# Patient Record
Sex: Female | Born: 1955 | Race: White | Hispanic: No | Smoking: Current every day smoker
Health system: Southern US, Community
[De-identification: ages and names within clinical notes are randomized; demographics above are authoritative.]

## PROBLEM LIST (undated history)

## (undated) DIAGNOSIS — E785 Hyperlipidemia, unspecified: Secondary | ICD-10-CM

## (undated) DIAGNOSIS — N951 Menopausal and female climacteric states: Secondary | ICD-10-CM

## (undated) HISTORY — PX: CHOLECYSTECTOMY: SHX55

## (undated) HISTORY — PX: KNEE SURGERY: SHX244

## (undated) HISTORY — PX: APPENDECTOMY: SHX54

## (undated) HISTORY — DX: Hyperlipidemia, unspecified: E78.5

---

## 1999-06-14 ENCOUNTER — Encounter: Admission: RE | Admit: 1999-06-14 | Discharge: 1999-06-14 | Payer: Self-pay | Admitting: Obstetrics and Gynecology

## 1999-06-14 ENCOUNTER — Encounter: Payer: Self-pay | Admitting: Obstetrics and Gynecology

## 2008-04-16 ENCOUNTER — Encounter: Admission: RE | Admit: 2008-04-16 | Discharge: 2008-04-16 | Payer: Self-pay | Admitting: Obstetrics and Gynecology

## 2008-09-14 IMAGING — CR DG CHEST 2V
2 series · 2 of 2 positions shown · non-contrast
Comparison: None

CLINICAL DATA: Chronic calculus cholecystitis.  Smoker.

CHEST - 2 VIEW

[w chest pa]
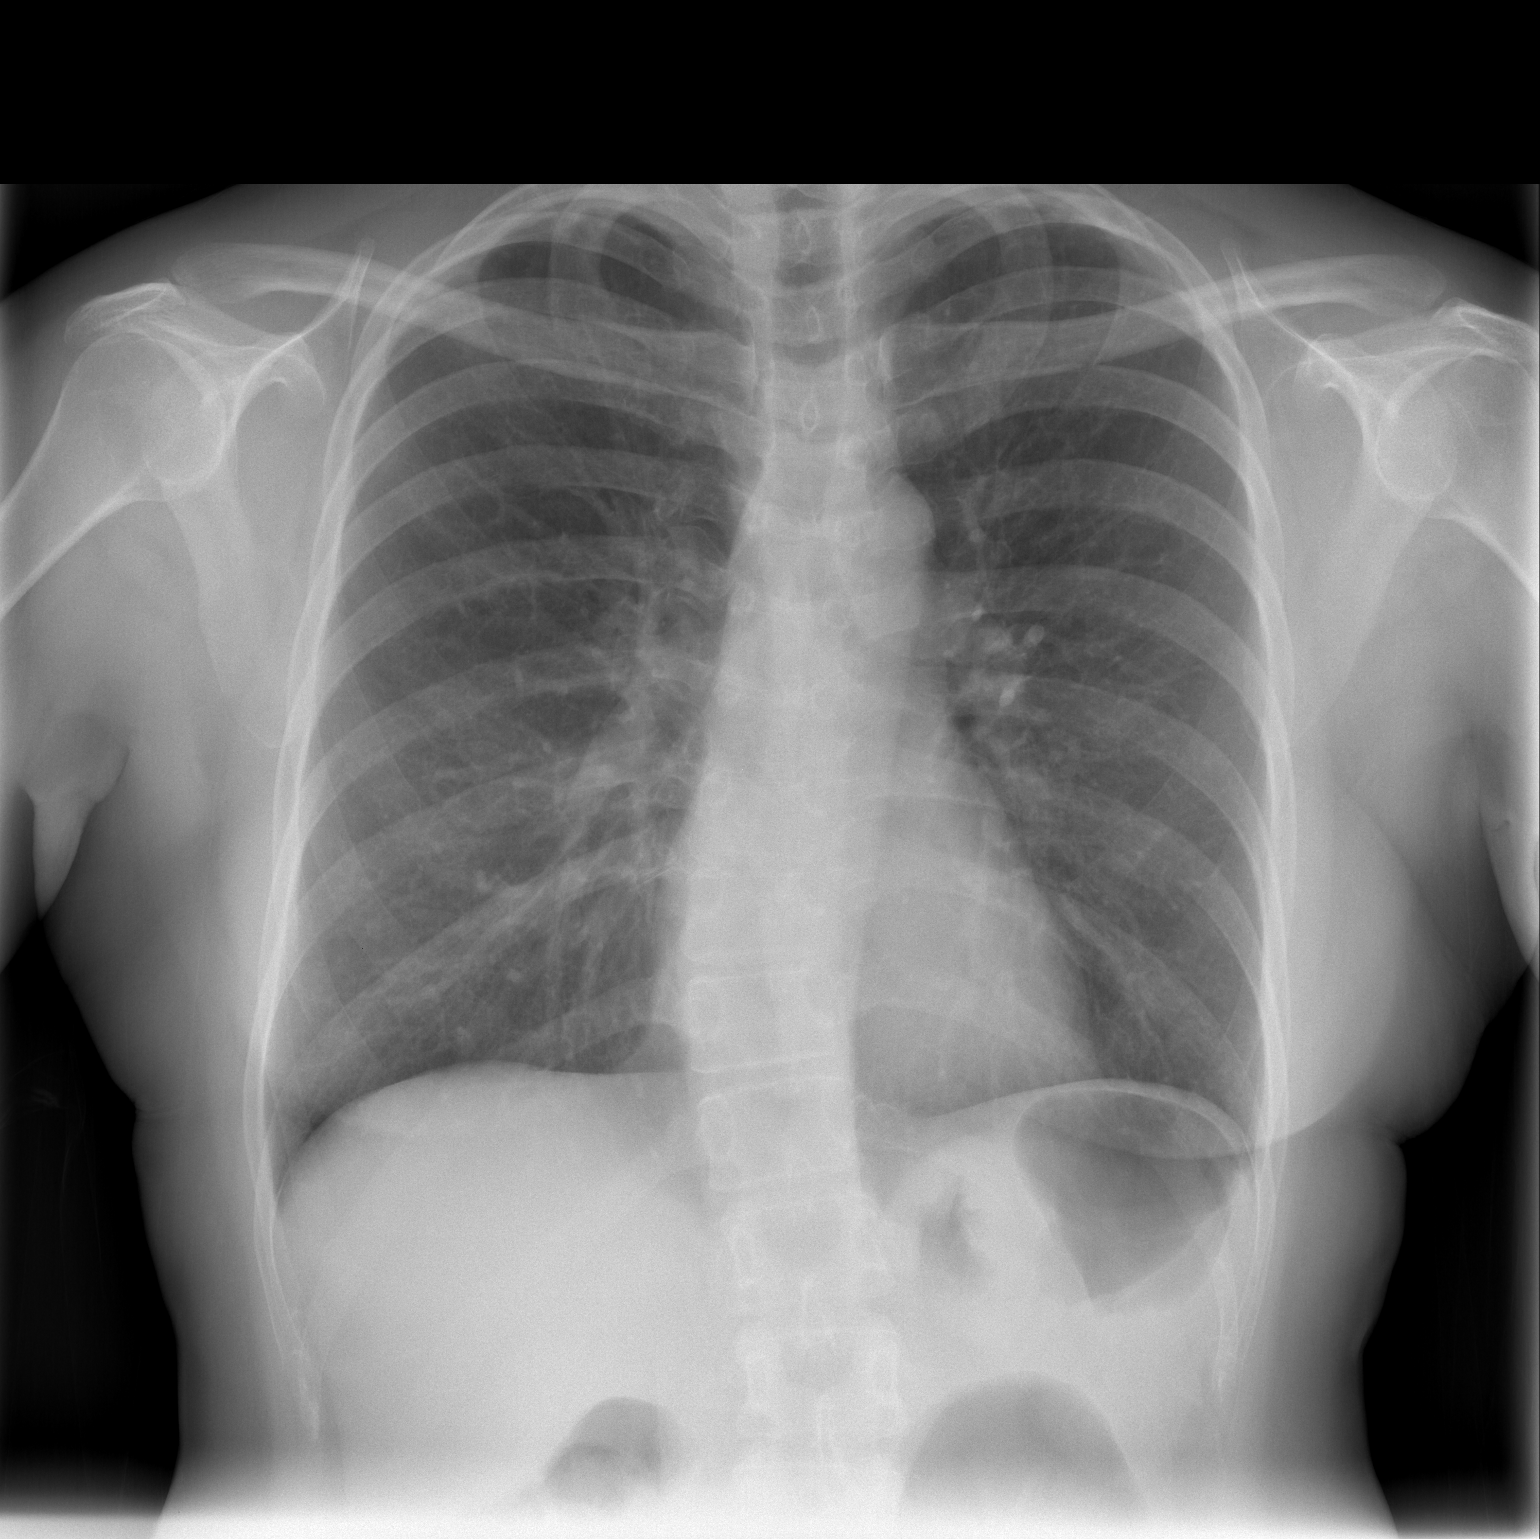

[w chest lat]
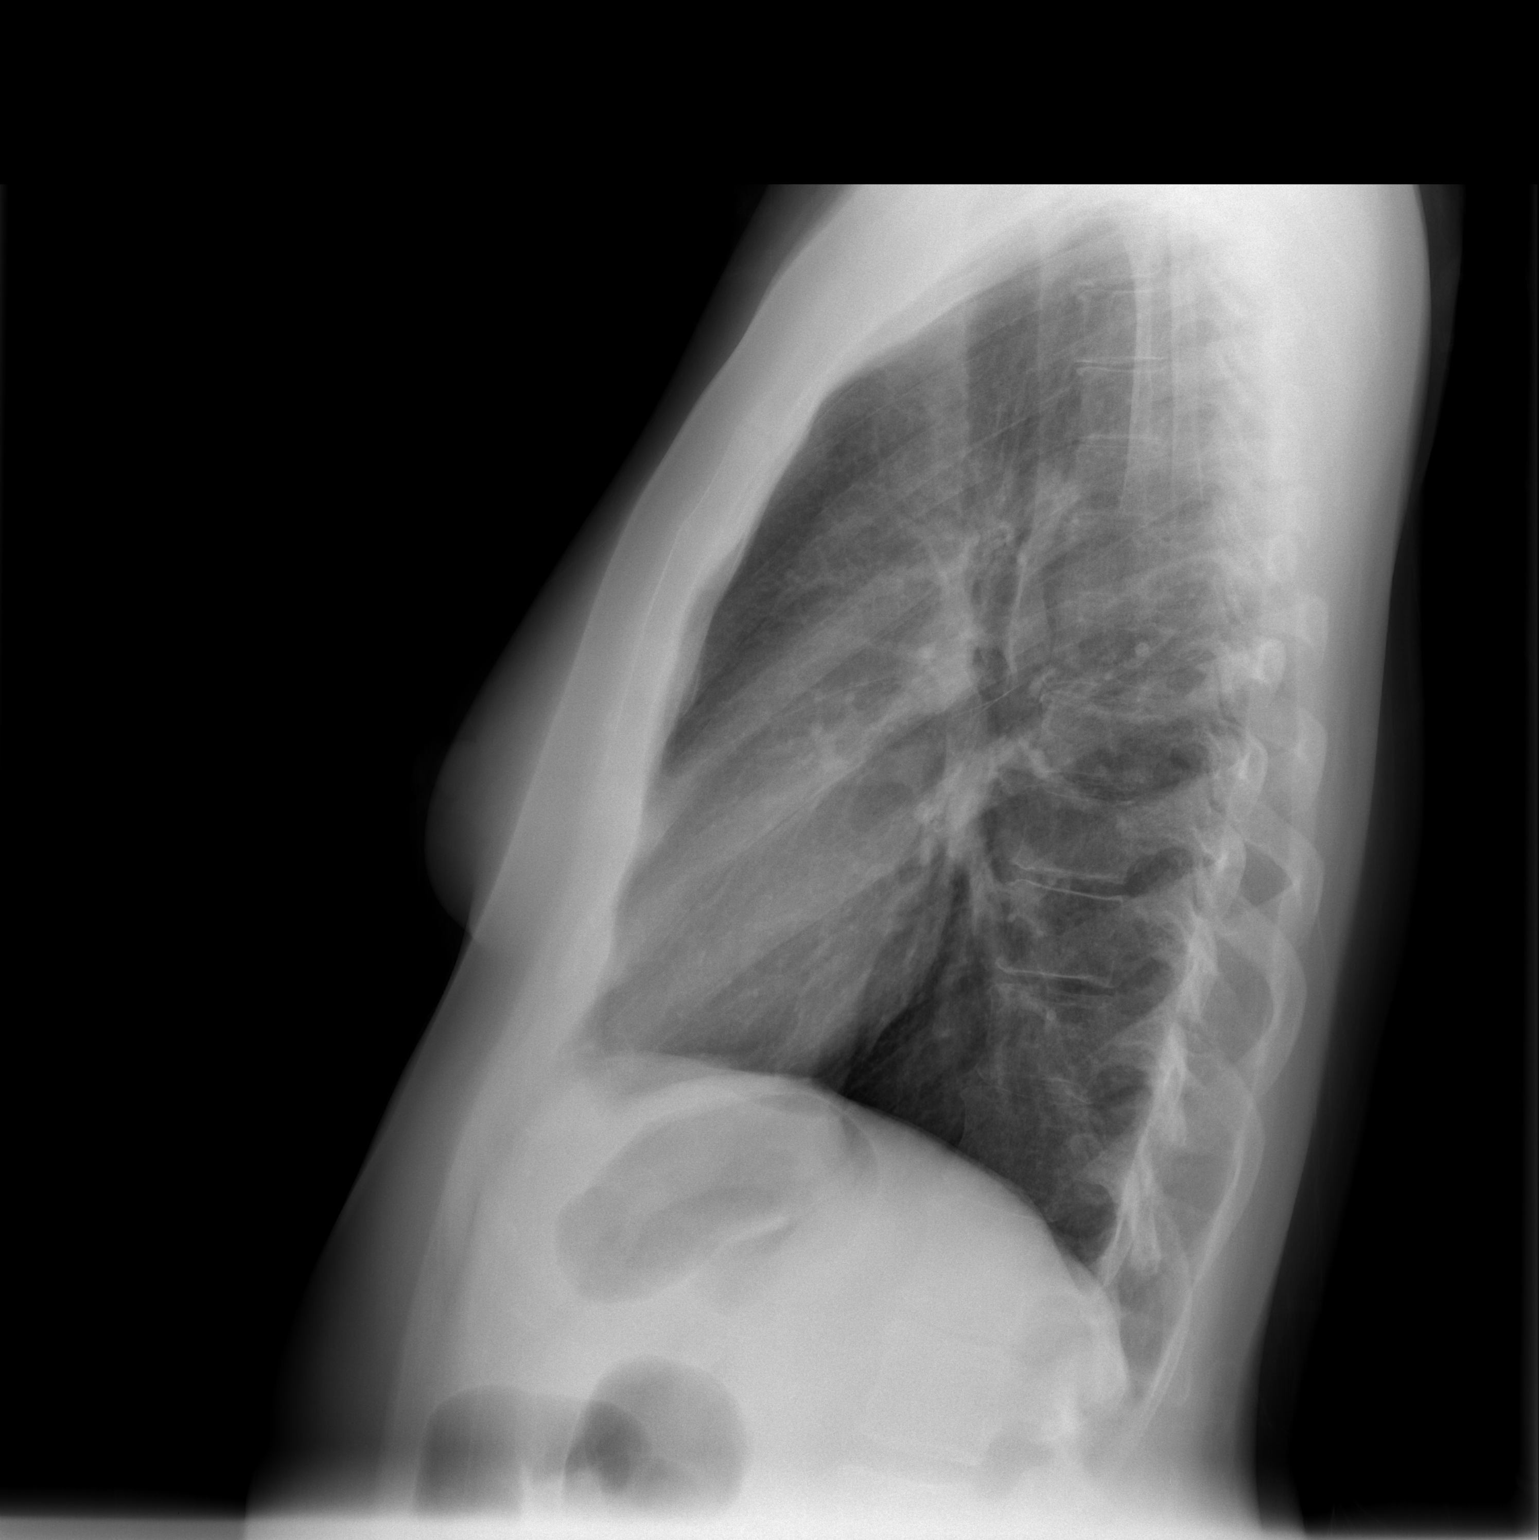

[2 of 2 positions shown; findings below may reference images not displayed]

FINDINGS: Normal cardiomediastinal silhouette.  Lungs are
hyperaerated with diaphragmatic depression.  Lower thoracic
scoliosis convex to the right.
IMPRESSION: COPD. No active chest disease.  Thoracic scoliosis.

## 2008-09-20 ENCOUNTER — Encounter (INDEPENDENT_AMBULATORY_CARE_PROVIDER_SITE_OTHER): Payer: Self-pay | Admitting: Surgery

## 2008-09-20 ENCOUNTER — Ambulatory Visit (HOSPITAL_COMMUNITY): Admission: RE | Admit: 2008-09-20 | Discharge: 2008-09-20 | Payer: Self-pay | Admitting: Surgery

## 2010-04-16 ENCOUNTER — Encounter: Payer: Self-pay | Admitting: Obstetrics and Gynecology

## 2010-06-07 ENCOUNTER — Other Ambulatory Visit: Payer: Self-pay | Admitting: Obstetrics and Gynecology

## 2010-07-03 LAB — COMPREHENSIVE METABOLIC PANEL
AST: 20 U/L (ref 0–37)
Albumin: 3.9 g/dL (ref 3.5–5.2)
Calcium: 9.6 mg/dL (ref 8.4–10.5)
Chloride: 102 mEq/L (ref 96–112)
Creatinine, Ser: 0.77 mg/dL (ref 0.4–1.2)
GFR calc Af Amer: >60 mL/min (ref >60)
Total Bilirubin: 0.7 mg/dL (ref 0.3–1.2)
Total Protein: 6.8 g/dL (ref 6.0–8.3)

## 2010-07-03 LAB — CBC
HCT: 44.6 % (ref 36.0–46.0)
Hemoglobin: 15.1 g/dL — ABNORMAL HIGH (ref 12.0–15.0)
MCHC: 34 g/dL (ref 30.0–36.0)
MCV: 95 fL (ref 78.0–100.0)
Platelets: 184 10*3/uL (ref 150–400)
RBC: 4.7 MIL/uL (ref 3.87–5.11)
RDW: 12.8 % (ref 11.5–15.5)
WBC: 8.2 10*3/uL (ref 4.0–10.5)

## 2010-07-03 LAB — DIFFERENTIAL
Eosinophils Relative: 3 % (ref 0–5)
Lymphocytes Relative: 36 % (ref 12–46)
Lymphs Abs: 2.9 10*3/uL (ref 0.7–4.0)
Monocytes Absolute: 0.6 10*3/uL (ref 0.1–1.0)
Monocytes Relative: 7 % (ref 3–12)
Neutro Abs: 4.4 10*3/uL (ref 1.7–7.7)

## 2010-08-08 NOTE — Op Note (Signed)
NAMEHOWARD, Rebecca Gibson                  ACCOUNT NO.:  [id number]   MEDICAL RECORD NO.:  [id number]          PATIENT TYPE:  AMB   LOCATION:  DAY                          FACILITY:  [facility]   PHYSICIAN:  Wilmon Arms. Corliss Skains, M.D. DATE OF BIRTH:  [birth year]   DATE OF PROCEDURE:  09/20/2008  DATE OF DISCHARGE:                               OPERATIVE REPORT   PREOPERATIVE DIAGNOSES:  Chronic calculus cholecystitis.   POSTOPERATIVE DIAGNOSES:  Chronic calculus cholecystitis.   PROCEDURE PERFORMED:  Laparoscopic cholecystectomy with intraoperative  cholangiogram.   SURGEON:  Wilmon Arms. Tsuei, M.D.   ANESTHESIA:  General.   INDICATIONS:  This is a 55 year old female in good health who presents  with several months of postprandial indigestion with epigastric  discomfort.  She has had one severe episode of pain that radiating  around her right side up into her back with nausea.  Ultrasound showed  multiple gallstones with some borderline wall thickening.  Her liver  function tests have been normal.  She has been asymptomatic since that  time.  She presents now for elective cholecystectomy.   DESCRIPTION OF PROCEDURE:  The patient was brought to the operating room  and placed in the supine position on the operating room table.  After an  adequate level of general anesthesia was obtained, the patient's abdomen  was prepped with Betadine and draped in a sterile fashion.  A timeout  was taken to assure the proper patient and proper procedure.  We  infiltrated the area below her umbilicus with quarter-percent Marcaine  with epinephrine.  We made a transverse incision and dissection was  carried down to the fascia.  The fascia was incised vertically and was  grasped Kocher clamps.  We entered the peritoneal cavity bluntly.  A  stay suture of 0 Vicryl was placed around the fascial opening.  The  Hasson cannula was inserted and secured with a stay suture.  Pneumoperitoneum was obtained by insufflating  CO2 maintaining a maximum  pressure of 15 mmHg.  A laparoscope was inserted and the patient was  positioned in reversed Trendelenburg and tilted to her left.  An 11 mm  port was placed in the subxiphoid position.  Two 5 mm ports placed the  right upper quadrant.  The edge of the liver was elevated and the  gallbladder was visualized.  There were lot of omental adhesions to the  surface of the gallbladder.  These were taken down with blunt  dissection, as well as cautery.  We dissected all the way down to the  hilum of the gallbladder.  We bluntly dissected around the cystic duct  circumferentially.  A clip was placed on the cystic duct distally.  A  small opening was created on the cystic duct.  The small blood vessel  running along the cystic duct began bleeding and this was controlled  with a clip.  A Cook cholangiogram catheter was inserted through a stab  incision and threaded into the cystic duct.  This was secured with a  clip.  Cholangiogram was then obtained which showed good flow proximally  and distally in the biliary tree with no sign of filling defect.  Contrast flowed easily into the duodenum.  The catheter was removed and  then the cystic duct was ligated, clipped and divided.  We placed a  second clip on the bleeding vessel.  The cystic artery was ligated clips  and divided.  The gallbladder was then dissected free from the  gallbladder fossa with cautery.  The gallbladder was placed in an  EndoCatch sac.  We thoroughly irrigated the gallbladder fossa and  hemostasis was good.  The gallbladder EndoCatch sac was then removed  from the umbilical port site.  The fascia was closed with a pursestring  suture.  We again inspected the right  upper quadrant for hemostasis.  Pneumoperitoneum was then released and  the trocars removed.  A 4-0 Monocryl was used to close the skin  incisions.  Steri-Strips and clean dressings were applied.  The patient  was then extubated and brought to  the recovery room in stable condition.  All sponge, instrument and needle counts were correct.      Wilmon Arms. Tsuei, M.D.  Electronically Signed     MKT/MEDQ  D:  09/20/2008  T:  09/20/2008  Job:  [id number]

## 2012-02-05 ENCOUNTER — Other Ambulatory Visit: Payer: Self-pay | Admitting: Obstetrics and Gynecology

## 2012-02-23 ENCOUNTER — Encounter (HOSPITAL_COMMUNITY): Payer: Self-pay | Admitting: Pharmacist

## 2012-02-25 ENCOUNTER — Encounter (HOSPITAL_COMMUNITY): Payer: Self-pay | Admitting: *Deleted

## 2012-03-07 ENCOUNTER — Encounter (HOSPITAL_COMMUNITY)
Admission: RE | Disposition: A | Discharge status: Home / Self Care | Payer: Self-pay | Source: Ambulatory Visit | Attending: Obstetrics and Gynecology

## 2012-03-07 ENCOUNTER — Encounter (HOSPITAL_COMMUNITY): Payer: Self-pay | Admitting: *Deleted

## 2012-03-07 ENCOUNTER — Encounter (HOSPITAL_COMMUNITY): Payer: Self-pay | Admitting: Anesthesiology

## 2012-03-07 ENCOUNTER — Ambulatory Visit (HOSPITAL_COMMUNITY): Payer: BC Managed Care – PPO | Admitting: Anesthesiology

## 2012-03-07 ENCOUNTER — Ambulatory Visit (HOSPITAL_COMMUNITY)
Admission: RE | Admit: 2012-03-07 | Discharge: 2012-03-07 | Disposition: A | Discharge status: Home / Self Care | Payer: BC Managed Care – PPO | Source: Ambulatory Visit | Attending: Obstetrics and Gynecology | Admitting: Obstetrics and Gynecology

## 2012-03-07 DIAGNOSIS — N950 Postmenopausal bleeding: Secondary | ICD-10-CM | POA: Insufficient documentation

## 2012-03-07 DIAGNOSIS — R87619 Unspecified abnormal cytological findings in specimens from cervix uteri: Secondary | ICD-10-CM | POA: Insufficient documentation

## 2012-03-07 HISTORY — PX: HYSTEROSCOPY WITH D & C: SHX1775

## 2012-03-07 HISTORY — DX: Menopausal and female climacteric states: N95.1

## 2012-03-07 LAB — CBC
HCT: 43.4 % (ref 36.0–46.0)
Hemoglobin: 14.8 g/dL (ref 12.0–15.0)
MCH: 31.5 pg (ref 26.0–34.0)
MCHC: 34.1 g/dL (ref 30.0–36.0)
MCV: 92.3 fL (ref 78.0–100.0)
RBC: 4.7 MIL/uL (ref 3.87–5.11)

## 2012-03-07 SURGERY — DILATATION AND CURETTAGE /HYSTEROSCOPY
Anesthesia: General | Site: Uterus | Wound class: Clean Contaminated

## 2012-03-07 MED ORDER — LIDOCAINE HCL (CARDIAC) 20 MG/ML IV SOLN
INTRAVENOUS | Status: DC | PRN
  Administered 2012-03-07: 50 mg via INTRAVENOUS

## 2012-03-07 MED ORDER — PROPOFOL 10 MG/ML IV EMUL
INTRAVENOUS | Status: AC
  Filled 2012-03-07: qty 20

## 2012-03-07 MED ORDER — ONDANSETRON HCL 4 MG/2ML IJ SOLN
INTRAMUSCULAR | Status: AC
  Filled 2012-03-07: qty 2

## 2012-03-07 MED ORDER — PROMETHAZINE HCL 25 MG/ML IJ SOLN: 6.2500 mg | INTRAMUSCULAR | Status: DC | PRN

## 2012-03-07 MED ORDER — LACTATED RINGERS IV SOLN
INTRAVENOUS | Status: DC
  Administered 2012-03-07 (×2): via INTRAVENOUS

## 2012-03-07 MED ORDER — MIDAZOLAM HCL 5 MG/5ML IJ SOLN
INTRAMUSCULAR | Status: DC | PRN
  Administered 2012-03-07: 2 mg via INTRAVENOUS

## 2012-03-07 MED ORDER — KETOROLAC TROMETHAMINE 30 MG/ML IJ SOLN: 15.0000 mg | Freq: Once | INTRAMUSCULAR | Status: DC | PRN

## 2012-03-07 MED ORDER — EPHEDRINE 5 MG/ML INJ
INTRAVENOUS | Status: AC
  Filled 2012-03-07: qty 10

## 2012-03-07 MED ORDER — OXYCODONE-ACETAMINOPHEN 5-325 MG PO TABS: 1.0000 | ORAL_TABLET | ORAL | Status: DC | PRN

## 2012-03-07 MED ORDER — LACTATED RINGERS IV SOLN
INTRAVENOUS | Status: DC | PRN
  Administered 2012-03-07: 12:00:00 via INTRAVENOUS

## 2012-03-07 MED ORDER — FENTANYL CITRATE 0.05 MG/ML IJ SOLN
INTRAMUSCULAR | Status: DC | PRN
  Administered 2012-03-07: 100 ug via INTRAVENOUS

## 2012-03-07 MED ORDER — IBUPROFEN 200 MG PO TABS: 600.0000 mg | ORAL_TABLET | Freq: Four times a day (QID) | ORAL | Status: DC | PRN

## 2012-03-07 MED ORDER — KETOROLAC TROMETHAMINE 30 MG/ML IJ SOLN
INTRAMUSCULAR | Status: DC | PRN
  Administered 2012-03-07: 30 mg via INTRAVENOUS

## 2012-03-07 MED ORDER — DEXAMETHASONE SODIUM PHOSPHATE 10 MG/ML IJ SOLN
INTRAMUSCULAR | Status: DC | PRN
  Administered 2012-03-07: 10 mg via INTRAVENOUS

## 2012-03-07 MED ORDER — ONDANSETRON HCL 4 MG/2ML IJ SOLN
INTRAMUSCULAR | Status: DC | PRN
  Administered 2012-03-07: 4 mg via INTRAVENOUS

## 2012-03-07 MED ORDER — MIDAZOLAM HCL 2 MG/2ML IJ SOLN: 0.5000 mg | Freq: Once | INTRAMUSCULAR | Status: DC | PRN

## 2012-03-07 MED ORDER — DEXAMETHASONE SODIUM PHOSPHATE 10 MG/ML IJ SOLN
INTRAMUSCULAR | Status: AC
  Filled 2012-03-07: qty 1

## 2012-03-07 MED ORDER — FENTANYL CITRATE 0.05 MG/ML IJ SOLN: 25.0000 ug | INTRAMUSCULAR | Status: DC | PRN

## 2012-03-07 MED ORDER — LIDOCAINE HCL (CARDIAC) 20 MG/ML IV SOLN
INTRAVENOUS | Status: AC
  Filled 2012-03-07: qty 5

## 2012-03-07 MED ORDER — KETOROLAC TROMETHAMINE 30 MG/ML IJ SOLN
INTRAMUSCULAR | Status: AC
  Filled 2012-03-07: qty 1

## 2012-03-07 MED ORDER — LIDOCAINE HCL 1 % IJ SOLN
INTRAMUSCULAR | Status: DC | PRN
  Administered 2012-03-07: 20 mL

## 2012-03-07 MED ORDER — MIDAZOLAM HCL 2 MG/2ML IJ SOLN
INTRAMUSCULAR | Status: AC
  Filled 2012-03-07: qty 2

## 2012-03-07 MED ORDER — EPHEDRINE SULFATE 50 MG/ML IJ SOLN
INTRAMUSCULAR | Status: DC | PRN
  Administered 2012-03-07 (×2): 10 mg via INTRAVENOUS

## 2012-03-07 MED ORDER — PROPOFOL 10 MG/ML IV EMUL
INTRAVENOUS | Status: DC | PRN
  Administered 2012-03-07: 200 mg via INTRAVENOUS

## 2012-03-07 MED ORDER — FENTANYL CITRATE 0.05 MG/ML IJ SOLN
INTRAMUSCULAR | Status: AC
  Filled 2012-03-07: qty 2

## 2012-03-07 MED ORDER — MEPERIDINE HCL 25 MG/ML IJ SOLN: 6.2500 mg | INTRAMUSCULAR | Status: DC | PRN

## 2012-03-07 SURGICAL SUPPLY — 16 items
CANISTER SUCTION 2500CC (MISCELLANEOUS) ×2 IMPLANT
CATH ROBINSON RED A/P 16FR (CATHETERS) ×2 IMPLANT
CLOTH BEACON ORANGE TIMEOUT ST (SAFETY) ×2 IMPLANT
CONTAINER PREFILL 10% NBF 60ML (FORM) ×4 IMPLANT
DRESSING TELFA 8X3 (GAUZE/BANDAGES/DRESSINGS) ×2 IMPLANT
ELECT REM PT RETURN 9FT ADLT (ELECTROSURGICAL) ×2
ELECTRODE REM PT RTRN 9FT ADLT (ELECTROSURGICAL) ×1 IMPLANT
GLOVE BIO SURGEON STRL SZ 6.5 (GLOVE) ×2 IMPLANT
GLOVE BIOGEL PI IND STRL 6.5 (GLOVE) ×2 IMPLANT
GLOVE BIOGEL PI INDICATOR 6.5 (GLOVE) ×2
GOWN STRL REIN XL XLG (GOWN DISPOSABLE) ×4 IMPLANT
LOOP ANGLED CUTTING 22FR (CUTTING LOOP) IMPLANT
PACK HYSTEROSCOPY LF (CUSTOM PROCEDURE TRAY) ×2 IMPLANT
PAD OB MATERNITY 4.3X12.25 (PERSONAL CARE ITEMS) ×2 IMPLANT
TOWEL OR 17X24 6PK STRL BLUE (TOWEL DISPOSABLE) ×4 IMPLANT
WATER STERILE IRR 1000ML POUR (IV SOLUTION) ×2 IMPLANT

## 2012-03-07 NOTE — H&P (Addendum)
56 y.o. yo complains of postmenopausal bleeding.  PMP since 02/2010 then had recent 4d period with all the usual menstrual symptoms.  Pt with hx of abn paps.  Recent PAP 07/05/11 showed AGUS, HPV neg.  Pt had colpo 09/18/11 ECC only was benign. EMB abundant mucin and superficial proliferative endometrial cells, making it difficult to evaluate for hyperplasia. Given unexplained AGUS discussed with pt further evaluation with fractional D&C hysteroscopy.  Past Medical History  Diagnosis Date  . SVD (spontaneous vaginal delivery)     x 2  . Hot flashes, menopausal     treatment with lexapro   Past Surgical History  Procedure Date  . Appendectomy   . Cholecystectomy   . Knee surgery     right    History   Social History  . Marital Status: Single    Spouse Name: N/A    Number of Children: N/A  . Years of Education: N/A   Occupational History  . Not on file.   Social History Main Topics  . Smoking status: Current Every Day Smoker -- 0.5 packs/day for 35 years  . Smokeless tobacco: Never Used  . Alcohol Use: Yes     Comment: socially  . Drug Use: No  . Sexually Active: Yes    Birth Control/ Protection: Post-menopausal   Other Topics Concern  . Not on file   Social History Narrative  . No narrative on file    No current facility-administered medications on file prior to encounter.   Current Outpatient Prescriptions on File Prior to Encounter  Medication Sig Dispense Refill  . escitalopram (LEXAPRO) 10 MG tablet Take 10 mg by mouth daily.        Allergies  Allergen Reactions  . Codeine Nausea Only    @VITALS2 @  Lungs: clear to ascultation Cor:  RRR Abdomen:  soft, nontender, nondistended. Ex:  no cords, erythema Pelvic:  Small AV uterus  A:  AGUS   P:  Fractional D&C hysteroscopy.   All risks, benefits and alternatives d/w patient and she desires to proceed.  Philip Aspen

## 2012-03-07 NOTE — Transfer of Care (Signed)
Immediate Anesthesia Transfer of Care Note  Patient: Rebecca Gibson  Procedure(s) Performed: Procedure(s) (LRB) with comments: DILATATION AND CURETTAGE /HYSTEROSCOPY (N/A)  Patient Location: PACU  Anesthesia Type:General  Level of Consciousness: awake, alert , oriented and patient cooperative  Airway & Oxygen Therapy: Patient Spontanous Breathing and Patient connected to nasal cannula oxygen  Post-op Assessment: Report given to PACU RN, Post -op Vital signs reviewed and stable and Patient moving all extremities X 4  Post vital signs: Reviewed and stable  Complications: No apparent anesthesia complications

## 2012-03-07 NOTE — Anesthesia Preprocedure Evaluation (Addendum)
Anesthesia Evaluation  Patient identified by MRN, date of birth, ID band Patient awake    Reviewed: Allergy & Precautions, H&P , Patient's Chart, lab work & pertinent test results, reviewed documented beta blocker date and time   History of Anesthesia Complications Negative for: history of anesthetic complications  Airway Mallampati: II TM Distance: >3 FB Neck ROM: full    Dental No notable dental hx.    Pulmonary neg pulmonary ROS,  breath sounds clear to auscultation  Pulmonary exam normal       Cardiovascular Exercise Tolerance: Good negative cardio ROS  Rhythm:regular Rate:Normal     Neuro/Psych negative neurological ROS  negative psych ROS   GI/Hepatic negative GI ROS, Neg liver ROS,   Endo/Other  negative endocrine ROS  Renal/GU negative Renal ROS     Musculoskeletal   Abdominal   Peds  Hematology negative hematology ROS (+)   Anesthesia Other Findings   Reproductive/Obstetrics negative OB ROS                           Anesthesia Physical Anesthesia Plan  ASA: II  Anesthesia Plan: General LMA   Post-op Pain Management:    Induction:   Airway Management Planned:   Additional Equipment:   Intra-op Plan:   Post-operative Plan:   Informed Consent: I have reviewed the patients History and Physical, chart, labs and discussed the procedure including the risks, benefits and alternatives for the proposed anesthesia with the patient or authorized representative who has indicated his/her understanding and acceptance.   Dental Advisory Given  Plan Discussed with: CRNA, Surgeon and Anesthesiologist  Anesthesia Plan Comments:         Anesthesia Quick Evaluation  

## 2012-03-07 NOTE — Op Note (Signed)
Preop: AGUS, PMP Postop: same Procedure: Fractional D&C hysteroscopy Surgeon: Delray Alt Anesthesia: IV sedation, 10cc 1% lidocaine Fluids: 800cc UOP: 100cc cath preop EBL: minimal Specimens: ECC, EMC Findings: normal appearing endometrium, moderate mucous Complications: none Condition: stable to PACU

## 2012-03-07 NOTE — Anesthesia Postprocedure Evaluation (Signed)
Anesthesia Post Note  Patient: Rebecca Gibson  Procedure(s) Performed: Procedure(s) (LRB): DILATATION AND CURETTAGE /HYSTEROSCOPY (N/A)  Anesthesia type: GA  Patient location: PACU  Post pain: Pain level controlled  Post assessment: Post-op Vital signs reviewed  Last Vitals:  Filed Vitals:   03/07/12 1315  BP: 101/64  Pulse: 91  Temp:   Resp: 21    Post vital signs: Reviewed  Level of consciousness: sedated  Complications: No apparent anesthesia complications

## 2012-03-10 ENCOUNTER — Encounter (HOSPITAL_COMMUNITY): Payer: Self-pay | Admitting: Obstetrics and Gynecology

## 2012-03-13 NOTE — Op Note (Signed)
Rebecca Gibson, Rebecca Gibson                  ACCOUNT NO.:  [id number]  MEDICAL RECORD NO.:  [id number]  LOCATION:  [facility]                          FACILITY:  [facility]  PHYSICIAN:  Philip Aspen, DO    DATE OF BIRTH:  10-22-1955  DATE OF PROCEDURE:  03/07/2012 DATE OF DISCHARGE:  03/07/2012                              OPERATIVE REPORT   PREOPERATIVE DIAGNOSIS:  Atypical glandular cells of undetermined significance, postmenopausal.  POSTOPERATIVE DIAGNOSIS:  Atypical glandular cells of undetermined significance, postmenopausal.  PROCEDURE:  Fractional D and C, hysteroscopy.  SURGEON:  Philip Aspen, DO  ANESTHESIA:  IV sedation with 10 mL of 1% lidocaine.  FLUIDS:  800 mL.  URINE OUTPUT:  100 mL, cathed preoperatively.  ESTIMATED BLOOD LOSS:  Minimal.  SPECIMEN:  ECC, EMC.  FINDINGS:  Normal-appearing endometrium of her mucus.  COMPLICATIONS:  None.  CONDITION:  Stable to PACU.  DESCRIPTION OF PROCEDURE:  The patient was taken to the operating room, where IV sedation was administered and found to be adequate.  She was then prepped and draped in normal sterile fashion in dorsal lithotomy position.  The patient was catheterized and a speculum was placed in the vagina to visualize the cervix, which was grasped with the single-tooth tenaculum.  Lidocaine cervical block was then administered at 4 and 8 o'clock respectively.  ECC of an endocervical canal was obtained.  The patient's uterus was sounded to approximately 7.5 cm.  The cervical os was dilated to approximately 23 Pratt.  The hysteroscope was then carried through the cervical canal and the endometrium was visualized. No gross abnormalities noted.  Hysteroscope was removed and curettage of all four quadrants was performed with good uterine cry.  Minimal tissue was obtained. Much of had very mucousy.  All instruments were then removed from the uterus and vagina.  Tenaculum sites were found to be hemostatic. Sponge, lap and  needle counts were correct x2.  The patient tolerated the procedure well and was brought to recovery in stable condition.          ______________________________ Philip Aspen, DO     [place]/MEDQ  D:  03/13/2012  T:  03/13/2012  Job:  [id number]

## 2012-07-30 ENCOUNTER — Other Ambulatory Visit: Payer: Self-pay

## 2012-11-28 ENCOUNTER — Encounter: Payer: Self-pay | Admitting: Physician Assistant

## 2012-11-28 ENCOUNTER — Ambulatory Visit (INDEPENDENT_AMBULATORY_CARE_PROVIDER_SITE_OTHER): Payer: BC Managed Care – PPO | Admitting: Physician Assistant

## 2012-11-28 VITALS — BP 129/78 | HR 81 | Temp 98.0°F | Ht 64.0 in | Wt 194.0 lb

## 2012-11-28 DIAGNOSIS — R232 Flushing: Secondary | ICD-10-CM

## 2012-11-28 DIAGNOSIS — N951 Menopausal and female climacteric states: Secondary | ICD-10-CM

## 2012-11-28 NOTE — Progress Notes (Signed)
  Subjective:    Patient ID: Rebecca Gibson, female    DOB: [birth year], 57 y.o.   MRN: [id number]  HPI57 y/o female presents for hot flashes and weight gain. Has been treated with hormonal therapy in the past but denied by her new Obgyn due to smoking. She has now stopped smoking but presents today and would like prescriptions for 2 medications she saw advertised on tv for hot flashes and weight loss.     Review of Systems  Constitutional: Positive for diaphoresis (frequent hot flashes) and appetite change (increased appetite and weight gain since she stopped smoking). Negative for fever, chills, activity change, fatigue and unexpected weight change.  HENT: Negative.   Eyes: Negative.   Respiratory: Negative.   Cardiovascular: Negative.   Gastrointestinal: Negative.   Endocrine: Negative.   Genitourinary: Negative.   Musculoskeletal: Negative.   Skin: Negative.   Neurological: Negative.        Objective:   Physical Exam  Constitutional: She is oriented to person, place, and time. She appears well-developed and well-nourished. No distress.  Pulmonary/Chest: Effort normal. No respiratory distress.  Musculoskeletal: Normal range of motion.  Neurological: She is alert and oriented to person, place, and time.  Skin: She is not diaphoretic.  Psychiatric: She has a normal mood and affect. Her behavior is normal. Judgment and thought content normal.          Assessment & Plan:  1. Hot flashes and weight gain:  After discussion with Dr. Modesto Charon, I do not feel that treatment with the medications suggested by the patient are suitable for treatment due to possible side effects and lack of research related to long term treatment. I discussed this with the patient today. I suggested Revivasoy for the hot flashes suggested by Dr. Modesto Charon. 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 I also suggested for her to return to her Obgyn for possible hormonal  treatment related to perimenopausal symptoms.

## 2012-11-28 NOTE — Patient Instructions (Addendum)
Exercise to Lose Weight Exercise and a healthy diet may help you lose weight. Your doctor may suggest specific exercises. EXERCISE IDEAS AND TIPS  Choose low-cost things you enjoy doing, such as walking, bicycling, or exercising to workout videos.  Take stairs instead of the elevator.  Walk during your lunch break.  Park your car further away from work or school.  Go to a gym or an exercise class.  Start with 5 to 10 minutes of exercise each day. Build up to 30 minutes of exercise 4 to 6 days a week.  Wear shoes with good support and comfortable clothes.  Stretch before and after working out.  Work out until you breathe harder and your heart beats faster.  Drink extra water when you exercise.  Do not do so much that you hurt yourself, feel dizzy, or get very short of breath. Exercises that burn about 150 calories:  Running 1  miles in 15 minutes.  Playing volleyball for 45 to 60 minutes.  Washing and waxing a car for 45 to 60 minutes.  Playing touch football for 45 minutes.  Walking 1  miles in 35 minutes.  Pushing a stroller 1  miles in 30 minutes.  Playing basketball for 30 minutes.  Raking leaves for 30 minutes.  Bicycling 5 miles in 30 minutes.  Walking 2 miles in 30 minutes.  Dancing for 30 minutes.  Shoveling snow for 15 minutes.  Swimming laps for 20 minutes.  Walking up stairs for 15 minutes.  Bicycling 4 miles in 15 minutes.  Gardening for 30 to 45 minutes.  Jumping rope for 15 minutes.  Washing windows or floors for 45 to 60 minutes. Document Released: 04/14/2010 Document Revised: 06/04/2011 Document Reviewed: 04/14/2010 [facility] Patient Information 2014 [place], [place].   Follow up with OBGYN regarding hormonal therapy

## 2013-06-30 ENCOUNTER — Ambulatory Visit (INDEPENDENT_AMBULATORY_CARE_PROVIDER_SITE_OTHER): Payer: BC Managed Care – PPO | Admitting: Family Medicine

## 2013-06-30 ENCOUNTER — Encounter: Payer: Self-pay | Admitting: Family Medicine

## 2013-06-30 ENCOUNTER — Encounter: Payer: Self-pay | Admitting: Cardiology

## 2013-06-30 ENCOUNTER — Encounter (INDEPENDENT_AMBULATORY_CARE_PROVIDER_SITE_OTHER): Payer: Self-pay

## 2013-06-30 VITALS — BP 143/81 | HR 96 | Temp 98.1°F | Ht 64.0 in | Wt 204.2 lb

## 2013-06-30 DIAGNOSIS — R002 Palpitations: Secondary | ICD-10-CM

## 2013-06-30 LAB — POCT CBC
Granulocyte percent: 63.4 %G (ref 37–80)
HCT, POC: 45.6 % (ref 37.7–47.9)
Hemoglobin: 15 g/dL (ref 12.2–16.2)
Lymph, poc: 3.8 — AB (ref 0.6–3.4)
MCH, POC: 29.6 pg (ref 27–31.2)
MCHC: 33 g/dL (ref 31.8–35.4)
MCV: 89.8 fL (ref 80–97)
MPV: 8.2 fL (ref 0–99.8)
POC Granulocyte: 7.1 — AB (ref 2–6.9)
POC LYMPH PERCENT: 33.8 %L (ref 10–50)
Platelet Count, POC: 258 10*3/uL (ref 142–424)
RBC: 5.1 M/uL (ref 4.04–5.48)
RDW, POC: 12.7 %
WBC: 11.2 10*3/uL — AB (ref 4.6–10.2)

## 2013-06-30 NOTE — Progress Notes (Signed)
   Subjective:    Patient ID: Rebecca Gibson, female    DOB: 1956-03-21, 59 y.o.   MRN: [id number]  HPI This 58 y.o. female presents for evaluation of heart palpitations.  She states she was using the treadmill yesterday and she developed heart palpitations and they last all afternoon until the evening and she states she has had heart palpitations for years and they last for 30 seconds and then gone. She states yesterday her heart palpations that lasted hours.  This am she walked on the treadmill and then took a shower and the palpations started up again.  She states sometimes she takes a deep breath and the palpations go away.  This am the palpations lasted from 0600 to 0800 and then went away. She is feeling fine and not having palpitations. She had sweats yesterday and this am when she was having palpations.  She c/o SOB and she felt like she had to take deep breaths to get her heart back into rhythm.  She denies chest pain.  She states last night she was at the gas station filling her vehicle up with gas and had the palpitations and felt like she was going to pass out.   Review of Systems C/o heart palpitations No chest pain, SOB, HA, dizziness, vision change, N/V, diarrhea, constipation, dysuria, urinary urgency or frequency, myalgias, arthralgias or rash.     Objective:   Physical Exam   Vital signs noted  Well developed well nourished female.  HEENT - Head atraumatic Normocephalic                Eyes - PERRLA, Conjuctiva - clear Sclera- Clear EOMI                Ears - EAC's Wnl TM's Wnl Gross Hearing WNL                Nose - Nares patent                 Throat - oropharanx wnl Respiratory - Lungs CTA bilateral Cardiac - RRR S1 and S2 without murmur GI - Abdomen soft Nontender and bowel sounds active x 4 Extremities - No edema. Neuro - Grossly intact.  EKG - NSR without acute st-t changes or ectopy     Assessment & Plan:  Palpitations - Plan: EKG 12-Lead, POCT CBC, CMP14+EGFR,  Thyroid Panel With TSH, Ambulatory referral to Cardiology, EKG 12-Lead Recommend if rapid heart rate returns then call 911 or go to ED  Lysbeth Penner FNP

## 2013-07-01 ENCOUNTER — Telehealth: Payer: Self-pay

## 2013-07-01 LAB — CMP14+EGFR
ALT: 16 IU/L (ref 0–32)
AST: 15 IU/L (ref 0–40)
Albumin/Globulin Ratio: 1.6 (ref 1.1–2.5)
Albumin: 4.4 g/dL (ref 3.5–5.5)
Alkaline Phosphatase: 118 IU/L — ABNORMAL HIGH (ref 39–117)
BUN/Creatinine Ratio: 23 (ref 9–23)
BUN: 15 mg/dL (ref 6–24)
CO2: 26 mmol/L (ref 18–29)
Calcium: 9.8 mg/dL (ref 8.7–10.2)
Chloride: 101 mmol/L (ref 97–108)
Creatinine, Ser: 0.65 mg/dL (ref 0.57–1.00)
GFR calc Af Amer: 114 mL/min/{1.73_m2} (ref >59)
GFR calc non Af Amer: 99 mL/min/{1.73_m2} (ref >59)
Globulin, Total: 2.7 g/dL (ref 1.5–4.5)
Glucose: 107 mg/dL — ABNORMAL HIGH (ref 65–99)
Potassium: 4.3 mmol/L (ref 3.5–5.2)
Sodium: 143 mmol/L (ref 134–144)
Total Bilirubin: 0.4 mg/dL (ref 0.0–1.2)
Total Protein: 7.1 g/dL (ref 6.0–8.5)

## 2013-07-01 LAB — THYROID PANEL WITH TSH
Free Thyroxine Index: 1.5 (ref 1.2–4.9)
T3 Uptake Ratio: 25 % (ref 24–39)
T4, Total: 6 ug/dL (ref 4.5–12.0)
TSH: 2.12 u[IU]/mL (ref 0.450–4.500)

## 2013-07-01 NOTE — Telephone Encounter (Signed)
[facility] called and scheduled her appointment for 07/22/13 9:30  Is that too far off???  Please call me

## 2013-07-02 NOTE — Telephone Encounter (Signed)
Would be nice to be able to get her in earlier but if not able then will have to wait and if having sx's and having Any syncope etc need to go to ED

## 2013-07-06 ENCOUNTER — Telehealth: Payer: Self-pay | Admitting: *Deleted

## 2013-07-06 ENCOUNTER — Encounter: Payer: BC Managed Care – PPO | Admitting: Family Medicine

## 2013-07-06 DIAGNOSIS — R002 Palpitations: Secondary | ICD-10-CM

## 2013-07-10 ENCOUNTER — Institutional Professional Consult (permissible substitution): Payer: BC Managed Care – PPO | Admitting: Cardiovascular Disease

## 2013-07-13 ENCOUNTER — Telehealth: Payer: Self-pay | Admitting: Family Medicine

## 2013-07-13 NOTE — Telephone Encounter (Signed)
Erroneous encounter

## 2013-07-13 NOTE — Telephone Encounter (Signed)
Pt notified of results Verbalizes understanding Will fax over to Dr Antoine PocheHochrein

## 2013-07-14 ENCOUNTER — Other Ambulatory Visit: Payer: Self-pay | Admitting: *Deleted

## 2013-07-14 DIAGNOSIS — R002 Palpitations: Secondary | ICD-10-CM

## 2013-07-15 ENCOUNTER — Encounter: Payer: Self-pay | Admitting: Cardiology

## 2013-07-15 ENCOUNTER — Ambulatory Visit (INDEPENDENT_AMBULATORY_CARE_PROVIDER_SITE_OTHER): Payer: BC Managed Care – PPO | Admitting: Cardiology

## 2013-07-15 ENCOUNTER — Ambulatory Visit: Payer: BC Managed Care – PPO | Admitting: Cardiology

## 2013-07-15 VITALS — BP 160/88 | HR 112 | Ht 65.0 in | Wt 211.5 lb

## 2013-07-15 DIAGNOSIS — R002 Palpitations: Secondary | ICD-10-CM | POA: Insufficient documentation

## 2013-07-15 DIAGNOSIS — R0602 Shortness of breath: Secondary | ICD-10-CM

## 2013-07-15 NOTE — Patient Instructions (Addendum)
The current medical regimen is effective;  continue present plan and medications.  Please have blood work today on the first floor ([facility])  BNP.  Follow up as needed.

## 2013-07-15 NOTE — Progress Notes (Signed)
HPI The patient presents for followup of palpitations. She has no prior cardiac history. She has had palpitations on and off for years but these were isolated and short lived. About 3 weeks ago she had palpitations that lasted all day long. She said they would come and stay for a while with an irregular beat. It might go away for several minutes and then come back. She was able to watch when the [employer] win the national championship!Marland Kitchen.  She went to bed and she had no problem sleeping.  She did have some palpitations the next day.  However, since then they have gone away.  The patient denies any new symptoms such as chest discomfort, neck or arm discomfort. There has been no new shortness of breath, PND or orthopnea. There have been no reported palpitations, presyncope or syncope.  She does walk on a treadmill without problems.  She wore an event monitor which demonstrated rare PACs and some atrial tachycardia but she had no symptoms with any of this.    Allergies  Allergen Reactions  . Codeine Nausea Only  . Ivp Dye [Iodinated Diagnostic Agents] Hives and Swelling    Current Outpatient Prescriptions  Medication Sig Dispense Refill  . Escitalopram Oxalate (LEXAPRO PO) Take 1 tablet by mouth as needed for hot flashes.       No current facility-administered medications for this visit.    Past Medical History  Diagnosis Date  . SVD (spontaneous vaginal delivery)     x 2  . Hot flashes, menopausal     treatment with lexapro    Past Surgical History  Procedure Laterality Date  . Appendectomy    . Cholecystectomy    . Knee surgery      right  . Hysteroscopy w/d&c  03/07/2012    Procedure: DILATATION AND CURETTAGE /HYSTEROSCOPY;  Surgeon: Philip AspenSidney Callahan, DO;  Location: [facility];  Service: Gynecology;  Laterality: N/A;    No family history on file.  History   Social History  . Marital Status: Single    Spouse Name: N/A    Number of Children: N/A  . Years of Education: N/A    Occupational History  . Not on file.   Social History Main Topics  . Smoking status: Former Smoker -- 0.50 packs/day for 35 years    Quit date: 03/16/2012  . Smokeless tobacco: Never Used  . Alcohol Use: Yes     Comment: socially  . Drug Use: No  . Sexual Activity: Yes    Birth Control/ Protection: Post-menopausal   Other Topics Concern  . Not on file   Social History Narrative  . No narrative on file    ROS:   Otherwise as stated in the HPI and negative for all other systems.  PHYSICAL EXAM BP 160/88  Pulse 112  Ht 5\' 5"  (1.651 m)  Wt 211 lb 8 oz (95.936 kg)  BMI 35.20 kg/m2 GENERAL:  Well appearing HEENT:  Pupils equal round and reactive, fundi not visualized, oral mucosa unremarkable NECK:  No jugular venous distention, waveform within normal limits, carotid upstroke brisk and symmetric, no bruits, no thyromegaly LYMPHATICS:  No cervical, inguinal adenopathy LUNGS:  Clear to auscultation bilaterally BACK:  No CVA tenderness CHEST:  Unremarkable HEART:  PMI not displaced or sustained,S1 and S2 within normal limits, no S3, no S4, no clicks, no rubs, no murmurs ABD:  Flat, positive bowel sounds normal in frequency in pitch, no bruits, no rebound, no guarding, no midline pulsatile mass, no  hepatomegaly, no splenomegaly EXT:  2 plus pulses throughout, no edema, no cyanosis no clubbing SKIN:  No rashes no nodules NEURO:  Cranial nerves II through XII grossly intact, motor grossly intact throughout PSYCH:  Cognitively intact, oriented to person place and time  EKG:   This rhythm, rate 112, axis within normal limits, intervals within normal limits, left atrial enlargement, no acute ST-T wave changes. 07/15/2013  ASSESSMENT AND PLAN  PALPITATIONS:  She has had palpitations that were somewhat sustained 3 weeks ago but none particularly bothersome since then. She did have some atrial tachycardia on her event monitor and I reviewed these. At this point we discussed avoidance  of caffeine if he should get worse. We discussed possible treatment with beta blocker or calcium channel blocker. I will check a BNP level and have a low threshold for followup echocardiography. However, I have no strong suspicion of structural heart disease. She will let me know if she has any increasing palpitations.  HTN:  Her blood pressure is elevated today. He was elevated at her previous reading but not before this. She will be keeping a blood pressure diary and she will need management if her pressures are not at target below 140/90.

## 2013-07-16 LAB — BRAIN NATRIURETIC PEPTIDE: Brain Natriuretic Peptide: 14.7 pg/mL (ref 0.0–100.0)

## 2013-07-22 ENCOUNTER — Ambulatory Visit: Payer: BC Managed Care – PPO | Admitting: Cardiology

## 2013-08-27 ENCOUNTER — Encounter: Payer: BC Managed Care – PPO | Admitting: Obstetrics and Gynecology

## 2014-08-17 ENCOUNTER — Other Ambulatory Visit: Payer: Self-pay

## 2014-08-18 LAB — CYTOLOGY - PAP

## 2014-09-20 ENCOUNTER — Encounter: Payer: Self-pay | Admitting: *Deleted

## 2015-04-13 ENCOUNTER — Encounter: Payer: Self-pay | Admitting: Pediatrics

## 2015-04-13 ENCOUNTER — Ambulatory Visit (INDEPENDENT_AMBULATORY_CARE_PROVIDER_SITE_OTHER): Payer: BLUE CROSS/BLUE SHIELD | Admitting: Pediatrics

## 2015-04-13 VITALS — BP 114/76 | HR 96 | Temp 97.4°F | Ht 65.0 in | Wt 198.0 lb

## 2015-04-13 DIAGNOSIS — L309 Dermatitis, unspecified: Secondary | ICD-10-CM | POA: Diagnosis not present

## 2015-04-13 DIAGNOSIS — L299 Pruritus, unspecified: Secondary | ICD-10-CM | POA: Diagnosis not present

## 2015-04-13 DIAGNOSIS — Z1211 Encounter for screening for malignant neoplasm of colon: Secondary | ICD-10-CM

## 2015-04-13 MED ORDER — HYDROXYZINE HCL 25 MG PO TABS: 25.0000 mg | ORAL_TABLET | Freq: Three times a day (TID) | ORAL | Status: DC | PRN

## 2015-04-13 MED ORDER — MOMETASONE FUROATE 0.1 % EX OINT: TOPICAL_OINTMENT | Freq: Every day | CUTANEOUS | Status: DC

## 2015-04-13 NOTE — Progress Notes (Signed)
Subjective:    Patient ID: Rebecca Gibson, female    DOB: 12/17/1955, 60 y.o.   MRN: [id number]  CC: Rash   HPI: Rebecca Gibson is a 60 y.o. female presenting for Rash  Past week has had worsening rash, started on upper R arm, red, itchy, progressed to b/l arms, some on flanks b/l, back of knees. Face, hands, feet, groin spared. Rash has not been changing. No crusting. Lives alone, no recent new exposures that she can think of, meds, fragrances, lotions, she checked her bed for bed bugs. Had similarly appearing rash over a year ago in her intertrigeminous areas, under breasts, in groin, both of which are now spared. Used hydrocortisone OTC on that rash and it improved. Now using hydrocortisone on this rash, minimal improvement. Hasnt taken anything PO.  3 days ago had URI symptoms for one day, congestion, now better No other recent illnesses. No med changes. Had labs drawn at work recently for wellness.   Depression screen [facility] 2/9 04/13/2015 06/30/2013  Decreased Interest 0 0  Down, Depressed, Hopeless 0 0  PHQ - 2 Score 0 0     Relevant past medical, surgical, family and social history reviewed and updated as indicated. Interim medical history since our last visit reviewed. Allergies and medications reviewed and updated.    ROS: Per HPI unless specifically indicated above  History  Smoking status  . Former Smoker -- 0.50 packs/day for 35 years  . Quit date: 03/16/2012  Smokeless tobacco  . Never Used    Past Medical History Patient Active Problem List   Diagnosis Date Noted  . Palpitation 07/15/2013    Current Outpatient Prescriptions  Medication Sig Dispense Refill  . hydrOXYzine (ATARAX/VISTARIL) 25 MG tablet Take 1 tablet (25 mg total) by mouth 3 (three) times daily as needed. 30 tablet 0  . mometasone (ELOCON) 0.1 % ointment Apply topically daily. 125 g 1  . PREMPRO 0.3-1.5 MG tablet     . venlafaxine XR (EFFEXOR-XR) 75 MG 24 hr capsule      No current  facility-administered medications for this visit.       Objective:    BP 114/76 mmHg  Pulse 96  Temp(Src) 97.4 F (36.3 C) (Oral)  Ht  (1.651 m)  Wt 198 lb (89.812 kg)  BMI 32.95 kg/m2  Wt Readings from Last 3 Encounters:  04/13/15 198 lb (89.812 kg)  07/15/13 211 lb 8 oz (95.936 kg)  06/30/13 204 lb 3.2 oz (92.625 kg)     Gen: NAD, alert, cooperative with exam, NCAT EYES: EOMI, no scleral injection or icterus ENT:  OP without erythema, no lesions LYMPH: no cervical LAD CV: NRRR, normal S1/S2, no murmur Resp: CTABL, no wheezes, normal WOB Ext: No edema, warm Neuro: Alert and oriented MSK: normal muscle bulk Skin: b/l arms, flanks with small 1-3 mm flat topped red papules, sometimes coalescing, some with excoriations. No crusting, no drainage.     Assessment & Plan:    Rebecca Gibson was seen today for dermatitis, appears to be a contact dermatitis. Will do trial of stronger topical steroids, atarax, let me know if not improving.   Diagnoses and all orders for this visit:  Dermatitis -     mometasone (ELOCON) 0.1 % ointment; Apply topically daily.  Itching -     hydrOXYzine (ATARAX/VISTARIL) 25 MG tablet; Take 1 tablet (25 mg total) by mouth 3 (three) times daily as needed.  Colon cancer screening Gave FOBT cards. Pt declined colonoscopy referral.  Follow up plan: prn  Rex Kras, MD [facility] 04/13/2015, 10:14 AM

## 2015-04-13 NOTE — Patient Instructions (Addendum)
Cetirizine during the day  Atarax or benadryl for itching, may make you sleepy

## 2015-08-23 ENCOUNTER — Other Ambulatory Visit: Payer: Self-pay | Admitting: Obstetrics and Gynecology

## 2015-08-24 LAB — CYTOLOGY - PAP

## 2017-12-10 DIAGNOSIS — Z1231 Encounter for screening mammogram for malignant neoplasm of breast: Secondary | ICD-10-CM | POA: Diagnosis not present

## 2017-12-10 DIAGNOSIS — Z6834 Body mass index (BMI) 34.0-34.9, adult: Secondary | ICD-10-CM | POA: Diagnosis not present

## 2017-12-10 DIAGNOSIS — Z124 Encounter for screening for malignant neoplasm of cervix: Secondary | ICD-10-CM | POA: Diagnosis not present

## 2017-12-10 DIAGNOSIS — Z01419 Encounter for gynecological examination (general) (routine) without abnormal findings: Secondary | ICD-10-CM | POA: Diagnosis not present

## 2018-01-30 DIAGNOSIS — K5904 Chronic idiopathic constipation: Secondary | ICD-10-CM | POA: Diagnosis not present

## 2018-01-30 DIAGNOSIS — Z1211 Encounter for screening for malignant neoplasm of colon: Secondary | ICD-10-CM | POA: Diagnosis not present

## 2018-01-30 DIAGNOSIS — E669 Obesity, unspecified: Secondary | ICD-10-CM | POA: Diagnosis not present

## 2018-01-30 DIAGNOSIS — Z8379 Family history of other diseases of the digestive system: Secondary | ICD-10-CM | POA: Diagnosis not present

## 2018-04-07 DIAGNOSIS — Z1211 Encounter for screening for malignant neoplasm of colon: Secondary | ICD-10-CM | POA: Diagnosis not present

## 2018-04-07 DIAGNOSIS — K635 Polyp of colon: Secondary | ICD-10-CM | POA: Diagnosis not present

## 2018-04-07 DIAGNOSIS — D125 Benign neoplasm of sigmoid colon: Secondary | ICD-10-CM | POA: Diagnosis not present

## 2018-04-07 LAB — HM COLONOSCOPY

## 2018-04-14 ENCOUNTER — Ambulatory Visit (INDEPENDENT_AMBULATORY_CARE_PROVIDER_SITE_OTHER): Payer: BLUE CROSS/BLUE SHIELD | Admitting: Family Medicine

## 2018-04-14 ENCOUNTER — Encounter: Payer: Self-pay | Admitting: Family Medicine

## 2018-04-14 VITALS — BP 130/74 | HR 88 | Temp 97.3°F | Ht 65.0 in | Wt 186.0 lb

## 2018-04-14 DIAGNOSIS — Z0000 Encounter for general adult medical examination without abnormal findings: Secondary | ICD-10-CM

## 2018-04-14 DIAGNOSIS — Z23 Encounter for immunization: Secondary | ICD-10-CM

## 2018-04-14 NOTE — Progress Notes (Signed)
Rebecca Gibson is a 63 y.o. female presents to office today for annual physical exam examination.    Concerns today include: 1. 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  Wears glasses Last dental exam: UTD Last colonoscopy: UTD, Dr Loreta Ave Last mammogram: UTD, see GYN for this Last pap smear: UTD, has hx of abnormal but this is followed yearly w. Dr Claiborne Billings. Refills needed today: n/a Immunizations needed: Shingles, PNA  Past Medical History:  Diagnosis Date  . Hot flashes, menopausal    treatment with lexapro  . SVD (spontaneous vaginal delivery)    x 2   Social History   Socioeconomic History  . Marital status: Single    Spouse name: Not on file  . Number of children: 2  . Years of education: Not on file  . Highest education level: Not on file  Occupational History    Employer: [employer]   Social Needs  . Financial resource strain: Not on file  . Food insecurity:    Worry: Not on file    Inability: Not on file  . Transportation needs:    Medical: Not on file    Non-medical: Not on file  Tobacco Use  . Smoking status: Former Smoker    Packs/day: 0.50    Years: 35.00    Pack years: 17.50    Last attempt to quit: 03/16/2012    Years since quitting: 6.0  . Smokeless tobacco: Never Used  Substance and Sexual Activity  . Alcohol use: Yes    Comment: socially  . Drug use: No  . Sexual activity: Yes    Birth control/protection: Post-menopausal  Lifestyle  . Physical activity:    Days per week: Not on file    Minutes per session: Not on file  . Stress: Not on file  Relationships  . Social connections:    Talks on phone: Not on file    Gets together: Not on file    Attends religious service: Not on file    Active member of club or organization: Not on file    Attends meetings of clubs or organizations: Not on  file    Relationship status: Not on file  . Intimate partner violence:    Fear of current or ex partner: Not on file    Emotionally abused: Not on file    Physically abused: Not on file    Forced sexual activity: Not on file  Other Topics Concern  . Not on file  Social History Narrative   Lives at home with youngest son.      Past Surgical History:  Procedure Laterality Date  . APPENDECTOMY    . CHOLECYSTECTOMY    . HYSTEROSCOPY W/D&C  03/07/2012   Procedure: DILATATION AND CURETTAGE /HYSTEROSCOPY;  Surgeon: Philip Aspen, DO;  Location: [facility];  Service: Gynecology;  Laterality: N/A;  . KNEE SURGERY     right   Family History  Problem Relation Age of Onset  . Hypertension Father     Current Outpatient Medications:  .  venlafaxine XR (EFFEXOR-XR) 75 MG 24 hr capsule, , Disp: , Rfl:   Allergies  Allergen Reactions  . Codeine Nausea Only  . Ivp Dye [Iodinated Diagnostic Agents] Hives and Swelling     ROS: Review of Systems Constitutional: negative Eyes: positive for contacts/glasses Ears, nose, mouth, throat, and face: negative Respiratory: negative Cardiovascular: negative  Gastrointestinal: negative Genitourinary:negative Integument/breast: negative Hematologic/lymphatic: negative Musculoskeletal:negative Neurological: negative Behavioral/Psych: negative Endocrine: negative Allergic/Immunologic: negative    Physical exam BP 130/74   Pulse 88   Temp (!) 97.3 F (36.3 C) (Oral)   Ht 5\' 5"  (1.651 m)   Wt 186 lb (84.4 kg)   BMI 30.95 kg/m  General appearance: alert, cooperative, appears stated age and no distress Head: Normocephalic, without obvious abnormality, atraumatic Eyes: negative findings: lids and lashes normal, conjunctivae and sclerae normal, corneas clear and pupils equal, round, reactive to light and accomodation Ears: normal TM's and external ear canals both ears Nose: Nares normal. Septum midline. Mucosa normal. No drainage or sinus  tenderness. Throat: lips, mucosa, and tongue normal; teeth and gums normal Neck: no adenopathy, supple, symmetrical, trachea midline and thyroid not enlarged, symmetric, no tenderness/mass/nodules Back: symmetric, no curvature. ROM normal. No CVA tenderness. Lungs: clear to auscultation bilaterally Heart: regular rate and rhythm, S1, S2 normal, no murmur, click, rub or gallop Abdomen: soft, non-tender; bowel sounds normal; no masses,  no organomegaly Extremities: extremities normal, atraumatic, no cyanosis or edema Pulses: 2+ and symmetric Skin: Skin color, texture, turgor normal. No rashes or lesions/  She has several pigmented nevi (she is followed by [facility] dermatology for this) Lymph nodes: Cervical, supraclavicular, and axillary nodes normal. Neurologic: Alert and oriented X 3, normal strength and tone. Normal symmetric reflexes. Normal coordination and gait Psych: Mood stable, speech normal, affect appropriate pleasant and interactive. Depression screen [facility] 2/9 04/14/2018 04/13/2015 06/30/2013  Decreased Interest 0 0 0  Down, Depressed, Hopeless 0 0 0  PHQ - 2 Score 0 0 0  Altered sleeping 0 - -  Tired, decreased energy 0 - -  Change in appetite 0 - -  Feeling bad or failure about yourself  0 - -  Trouble concentrating 0 - -  Moving slowly or fidgety/restless 0 - -  Suicidal thoughts 0 - -  PHQ-9 Score 0 - -    Assessment/ Plan: Rebecca Gibson here for annual physical exam.   1. Wellness examination She is doing well.  She is well plugged into gynecology, gastroenterology and dermatology and is up-to-date on her screening.  We will plan for release of records from her specialists and update her chart accordingly.  2. Need for vaccination Shingles and PNA vaccines administered today.  Patient to follow up in 1 year for annual exam or sooner if needed.  Rebecca Gibson M. Nadine Counts, DO

## 2018-04-14 NOTE — Patient Instructions (Signed)
You were given the Shingles vaccine and Pneumonia vaccine today.

## 2018-09-02 ENCOUNTER — Telehealth: Payer: Self-pay | Admitting: Pediatrics

## 2018-09-02 NOTE — Telephone Encounter (Signed)
Pt notified ok to come in for shingles vaccine

## 2018-10-01 ENCOUNTER — Other Ambulatory Visit: Payer: Self-pay

## 2018-10-02 ENCOUNTER — Ambulatory Visit (INDEPENDENT_AMBULATORY_CARE_PROVIDER_SITE_OTHER): Payer: BC Managed Care – PPO | Admitting: *Deleted

## 2018-10-02 DIAGNOSIS — Z23 Encounter for immunization: Secondary | ICD-10-CM | POA: Diagnosis not present

## 2018-10-02 NOTE — Progress Notes (Signed)
Pt given 2nd Shingrix vaccine Tolerated well 

## 2018-10-03 ENCOUNTER — Ambulatory Visit: Payer: BLUE CROSS/BLUE SHIELD

## 2019-01-30 DIAGNOSIS — Z124 Encounter for screening for malignant neoplasm of cervix: Secondary | ICD-10-CM | POA: Diagnosis not present

## 2019-01-30 DIAGNOSIS — Z1231 Encounter for screening mammogram for malignant neoplasm of breast: Secondary | ICD-10-CM | POA: Diagnosis not present

## 2019-01-30 DIAGNOSIS — Z6834 Body mass index (BMI) 34.0-34.9, adult: Secondary | ICD-10-CM | POA: Diagnosis not present

## 2019-01-30 DIAGNOSIS — Z01419 Encounter for gynecological examination (general) (routine) without abnormal findings: Secondary | ICD-10-CM | POA: Diagnosis not present

## 2020-07-22 ENCOUNTER — Other Ambulatory Visit: Payer: Self-pay

## 2020-07-22 ENCOUNTER — Ambulatory Visit (INDEPENDENT_AMBULATORY_CARE_PROVIDER_SITE_OTHER): Payer: No Typology Code available for payment source | Admitting: Family Medicine

## 2020-07-22 ENCOUNTER — Encounter: Payer: Self-pay | Admitting: Family Medicine

## 2020-07-22 VITALS — BP 114/85 | HR 100 | Temp 97.6°F | Ht 65.0 in | Wt 207.0 lb

## 2020-07-22 DIAGNOSIS — Z0000 Encounter for general adult medical examination without abnormal findings: Secondary | ICD-10-CM

## 2020-07-22 DIAGNOSIS — Z114 Encounter for screening for human immunodeficiency virus [HIV]: Secondary | ICD-10-CM | POA: Diagnosis not present

## 2020-07-22 DIAGNOSIS — Z1159 Encounter for screening for other viral diseases: Secondary | ICD-10-CM

## 2020-07-22 DIAGNOSIS — Z0001 Encounter for general adult medical examination with abnormal findings: Secondary | ICD-10-CM | POA: Diagnosis not present

## 2020-07-22 DIAGNOSIS — E669 Obesity, unspecified: Secondary | ICD-10-CM

## 2020-07-22 DIAGNOSIS — R5383 Other fatigue: Secondary | ICD-10-CM

## 2020-07-22 LAB — BAYER DCA HB A1C WAIVED: HB A1C (BAYER DCA - WAIVED): 6.4 % (ref <7.0)

## 2020-07-22 NOTE — Progress Notes (Signed)
Rebecca Gibson is a 65 y.o. female presents to office today for annual physical exam examination.    Concerns today include: 1.  Obesity Patient notes that since she retired she has not really been motivated to complete tasks, exercise.  She does keep her grandchildren a couple of days per week and is very active on those days but often the following day will be totally exhausted and not really move off the couch much.  She used to walk a couple miles daily after work but no longer does this.  She identifies this is just "being lazy".  She wants to get some labs done today to see if perhaps this might kick her in the right direction.  No chest pain, dizziness, edema but she does report some dyspnea on exertion which again she identifies from having gained weight.  She reports fatigue.  Unsure if she snores or has apneic spells   Occupation: Retired,  Substance use: None Diet: Fair, Exercise: No structured Last eye exam: Needs Last dental exam: Up-to-date Last colonoscopy: Up-to-date, had in 2020 with outside GI provider on [facility] in [place] Last mammogram: Up-to-date.  Performed with Dr. Rogue Bussing this year Last pap smear: Up-to-date.  Performed with Dr. Rogue Bussing Refills needed today: None Immunizations needed: Declines Tdap  Past Medical History:  Diagnosis Date  . Hot flashes, menopausal    treatment with lexapro  . SVD (spontaneous vaginal delivery)    x 2   Social History   Socioeconomic History  . Marital status: Single    Spouse name: Not on file  . Number of children: 2  . Years of education: Not on file  . Highest education level: Not on file  Occupational History    Employer: [employer]   Tobacco Use  . Smoking status: Former Smoker    Packs/day: 0.50    Years: 35.00    Pack years: 17.50    Quit date: 03/16/2012    Years since quitting: 8.3  . Smokeless tobacco: Never Used  Substance and Sexual Activity  . Alcohol use: Yes    Comment: socially   . Drug use: No  . Sexual activity: Yes    Birth control/protection: Post-menopausal  Other Topics Concern  . Not on file  Social History Narrative   Lives at home with youngest son.      Social Determinants of Health   Financial Resource Strain: Not on file  Food Insecurity: Not on file  Transportation Needs: Not on file  Physical Activity: Not on file  Stress: Not on file  Social Connections: Not on file  Intimate Partner Violence: Not on file   Past Surgical History:  Procedure Laterality Date  . APPENDECTOMY    . CHOLECYSTECTOMY    . HYSTEROSCOPY WITH D & C  03/07/2012   Procedure: DILATATION AND CURETTAGE /HYSTEROSCOPY;  Surgeon: Allyn Kenner, DO;  Location: [facility];  Service: Gynecology;  Laterality: N/A;  . KNEE SURGERY     right   Family History  Problem Relation Age of Onset  . Hypertension Father     Current Outpatient Medications:  .  venlafaxine XR (EFFEXOR-XR) 75 MG 24 hr capsule, , Disp: , Rfl:   Allergies  Allergen Reactions  . Codeine Nausea Only  . Ivp Dye [Iodinated Diagnostic Agents] Hives and Swelling     ROS: Review of Systems Pertinent items noted in HPI and remainder of comprehensive ROS otherwise negative.    Physical exam BP 114/85   Pulse 100  Temp 97.6 F (36.4 C)   Ht $R'5\' 5"'Gd$  (1.651 m)   Wt 207 lb (93.9 kg)   SpO2 95%   BMI 34.45 kg/m  General appearance: alert, cooperative, appears stated age and no distress Head: Normocephalic, without obvious abnormality, atraumatic Eyes: negative findings: lids and lashes normal, conjunctivae and sclerae normal, corneas clear and pupils equal, round, reactive to light and accomodation Ears: normal TM's and external ear canals both ears Nose: Nares normal. Septum midline. Mucosa normal. No drainage or sinus tenderness. Throat: lips, mucosa, and tongue normal; teeth and gums normal Neck: no adenopathy, no carotid bruit, supple, symmetrical, trachea midline and thyroid not enlarged,  symmetric, no tenderness/mass/nodules Back: symmetric, no curvature. ROM normal. No CVA tenderness. Lungs: clear to auscultation bilaterally Heart: regular rate and rhythm, S1, S2 normal, no murmur, click, rub or gallop Abdomen: soft, non-tender; bowel sounds normal; no masses,  no organomegaly Extremities: extremities normal, atraumatic, no cyanosis or edema Pulses: 2+ and symmetric Skin: Skin color, texture, turgor normal. No rashes or lesions Lymph nodes: Cervical, supraclavicular, and axillary nodes normal. Neurologic: Alert and oriented X 3, normal strength and tone. Normal symmetric reflexes. Normal coordination and gait    Assessment/ Plan: Rebecca Gibson here for annual physical exam.   Annual physical exam  Need for hepatitis C screening test - Plan: Hepatitis C antibody  Encounter for screening for HIV - Plan: HIV Antibody (routine testing w rflx)  Obesity (BMI 30-39.9) - Plan: Lipid Panel, CMP14+EGFR, TSH, Bayer DCA Hb A1c Waived  Fatigue, unspecified type - Plan: TSH, Bayer DCA Hb A1c Waived, CBC, Vitamin B12  She has gained some weight since our last visit, which she is not happy about.  She recognizes her barriers and seems to be trying to get her lifestyle modified.  We will check fasting labs today and I did discuss with her consideration for Dr. Leafy Ro.  She seems interested in this but wants see what the labs are first  She expressed some fatigability.  I question possible undiagnosed sleep apnea.  We will see what her labs look like first but we may need to revisit this  Release of information form completed for her Pap and mammogram.  She had colonoscopy performed in 2020 so we will try and get this record as well  Counseled on healthy lifestyle choices, including diet (rich in fruits, vegetables and lean meats and low in salt and simple carbohydrates) and exercise (at least 30 minutes of moderate physical activity daily).  Patient to follow up in 1 year for annual  exam or sooner if needed.  Rebecca Gibson M. Lajuana Ripple, DO

## 2020-07-23 LAB — CMP14+EGFR
ALT: 8 IU/L (ref 0–32)
AST: 14 IU/L (ref 0–40)
Albumin/Globulin Ratio: 1.2 (ref 1.2–2.2)
Albumin: 4.1 g/dL (ref 3.8–4.8)
Alkaline Phosphatase: 101 IU/L (ref 44–121)
BUN/Creatinine Ratio: 38 — ABNORMAL HIGH (ref 12–28)
BUN: 21 mg/dL (ref 8–27)
Bilirubin Total: 0.4 mg/dL (ref 0.0–1.2)
CO2: 22 mmol/L (ref 20–29)
Calcium: 9.4 mg/dL (ref 8.7–10.3)
Chloride: 100 mmol/L (ref 96–106)
Creatinine, Ser: 0.56 mg/dL — ABNORMAL LOW (ref 0.57–1.00)
Globulin, Total: 3.3 g/dL (ref 1.5–4.5)
Glucose: 103 mg/dL — ABNORMAL HIGH (ref 65–99)
Potassium: 4.2 mmol/L (ref 3.5–5.2)
Sodium: 141 mmol/L (ref 134–144)
Total Protein: 7.4 g/dL (ref 6.0–8.5)
eGFR: 102 mL/min/{1.73_m2} (ref >59)

## 2020-07-23 LAB — CBC
Hematocrit: 46.2 % (ref 34.0–46.6)
Hemoglobin: 15.6 g/dL (ref 11.1–15.9)
MCH: 30.7 pg (ref 26.6–33.0)
MCHC: 33.8 g/dL (ref 31.5–35.7)
MCV: 91 fL (ref 79–97)
Platelets: 253 10*3/uL (ref 150–450)
RBC: 5.08 x10E6/uL (ref 3.77–5.28)
RDW: 12.8 % (ref 11.7–15.4)
WBC: 10.7 10*3/uL (ref 3.4–10.8)

## 2020-07-23 LAB — HEPATITIS C ANTIBODY: Hep C Virus Ab: <0.1 s/co ratio (ref 0.0–0.9)

## 2020-07-23 LAB — HIV ANTIBODY (ROUTINE TESTING W REFLEX): HIV Screen 4th Generation wRfx: NONREACTIVE

## 2020-07-23 LAB — TSH: TSH: 3.68 u[IU]/mL (ref 0.450–4.500)

## 2020-07-23 LAB — LIPID PANEL
Chol/HDL Ratio: 4.1 ratio (ref 0.0–4.4)
Cholesterol, Total: 188 mg/dL (ref 100–199)
HDL: 46 mg/dL (ref >39)
LDL Chol Calc (NIH): 96 mg/dL (ref 0–99)
Triglycerides: 273 mg/dL — ABNORMAL HIGH (ref 0–149)
VLDL Cholesterol Cal: 46 mg/dL — ABNORMAL HIGH (ref 5–40)

## 2020-07-23 LAB — VITAMIN B12: Vitamin B-12: 676 pg/mL (ref 232–1245)

## 2020-07-25 ENCOUNTER — Other Ambulatory Visit: Payer: Self-pay | Admitting: *Deleted

## 2020-07-25 DIAGNOSIS — E7800 Pure hypercholesterolemia, unspecified: Secondary | ICD-10-CM

## 2020-07-25 MED ORDER — ROSUVASTATIN CALCIUM 5 MG PO TABS: 5.0000 mg | ORAL_TABLET | Freq: Every day | ORAL | 3 refills | Status: DC

## 2020-09-07 ENCOUNTER — Encounter: Payer: BC Managed Care – PPO | Admitting: Family Medicine

## 2020-11-03 ENCOUNTER — Other Ambulatory Visit: Payer: Self-pay

## 2020-11-03 ENCOUNTER — Other Ambulatory Visit: Payer: No Typology Code available for payment source

## 2020-11-03 DIAGNOSIS — E7800 Pure hypercholesterolemia, unspecified: Secondary | ICD-10-CM

## 2020-11-04 LAB — LIPID PANEL
Chol/HDL Ratio: 2.6 ratio (ref 0.0–4.4)
Cholesterol, Total: 118 mg/dL (ref 100–199)
HDL: 46 mg/dL (ref >39)
LDL Chol Calc (NIH): 48 mg/dL (ref 0–99)
Triglycerides: 139 mg/dL (ref 0–149)
VLDL Cholesterol Cal: 24 mg/dL (ref 5–40)

## 2020-11-04 LAB — HEPATIC FUNCTION PANEL
ALT: 10 IU/L (ref 0–32)
AST: 15 IU/L (ref 0–40)
Albumin: 4.4 g/dL (ref 3.8–4.8)
Alkaline Phosphatase: 91 IU/L (ref 44–121)
Bilirubin Total: 0.5 mg/dL (ref 0.0–1.2)
Bilirubin, Direct: 0.18 mg/dL (ref 0.00–0.40)
Total Protein: 7.1 g/dL (ref 6.0–8.5)

## 2020-11-13 ENCOUNTER — Encounter: Payer: Self-pay | Admitting: Family Medicine

## 2021-02-15 ENCOUNTER — Other Ambulatory Visit: Payer: Self-pay

## 2021-02-15 ENCOUNTER — Other Ambulatory Visit: Payer: Self-pay | Admitting: Family Medicine

## 2021-02-15 ENCOUNTER — Telehealth: Payer: Self-pay | Admitting: Family Medicine

## 2021-02-15 ENCOUNTER — Other Ambulatory Visit: Payer: Medicare HMO

## 2021-02-15 DIAGNOSIS — R739 Hyperglycemia, unspecified: Secondary | ICD-10-CM | POA: Diagnosis not present

## 2021-02-15 DIAGNOSIS — E7800 Pure hypercholesterolemia, unspecified: Secondary | ICD-10-CM

## 2021-02-15 LAB — BAYER DCA HB A1C WAIVED: HB A1C (BAYER DCA - WAIVED): 5.9 % — ABNORMAL HIGH (ref 4.8–5.6)

## 2021-02-15 NOTE — Telephone Encounter (Signed)
Future labs placed patient aware

## 2021-02-16 LAB — CMP14+EGFR
ALT: 8 IU/L (ref 0–32)
AST: 14 IU/L (ref 0–40)
Albumin/Globulin Ratio: 1.9 (ref 1.2–2.2)
Albumin: 4.4 g/dL (ref 3.8–4.8)
Alkaline Phosphatase: 98 IU/L (ref 44–121)
BUN/Creatinine Ratio: 27 (ref 12–28)
BUN: 18 mg/dL (ref 8–27)
Bilirubin Total: 0.3 mg/dL (ref 0.0–1.2)
CO2: 27 mmol/L (ref 20–29)
Calcium: 9.2 mg/dL (ref 8.7–10.3)
Chloride: 105 mmol/L (ref 96–106)
Creatinine, Ser: 0.67 mg/dL (ref 0.57–1.00)
Globulin, Total: 2.3 g/dL (ref 1.5–4.5)
Glucose: 100 mg/dL — ABNORMAL HIGH (ref 70–99)
Potassium: 4.5 mmol/L (ref 3.5–5.2)
Sodium: 143 mmol/L (ref 134–144)
Total Protein: 6.7 g/dL (ref 6.0–8.5)
eGFR: 97 mL/min/{1.73_m2} (ref >59)

## 2021-02-16 LAB — LIPID PANEL
Chol/HDL Ratio: 3.3 ratio (ref 0.0–4.4)
Cholesterol, Total: 140 mg/dL (ref 100–199)
HDL: 42 mg/dL (ref >39)
LDL Chol Calc (NIH): 74 mg/dL (ref 0–99)
Triglycerides: 138 mg/dL (ref 0–149)
VLDL Cholesterol Cal: 24 mg/dL (ref 5–40)

## 2021-02-23 DIAGNOSIS — Z7689 Persons encountering health services in other specified circumstances: Secondary | ICD-10-CM | POA: Diagnosis not present

## 2021-02-23 DIAGNOSIS — Z124 Encounter for screening for malignant neoplasm of cervix: Secondary | ICD-10-CM | POA: Diagnosis not present

## 2021-02-23 DIAGNOSIS — Z01419 Encounter for gynecological examination (general) (routine) without abnormal findings: Secondary | ICD-10-CM | POA: Diagnosis not present

## 2021-02-23 DIAGNOSIS — Z1231 Encounter for screening mammogram for malignant neoplasm of breast: Secondary | ICD-10-CM | POA: Diagnosis not present

## 2021-02-28 ENCOUNTER — Other Ambulatory Visit: Payer: Self-pay | Admitting: Obstetrics and Gynecology

## 2021-02-28 DIAGNOSIS — E2839 Other primary ovarian failure: Secondary | ICD-10-CM

## 2021-03-09 ENCOUNTER — Ambulatory Visit
Admission: RE | Admit: 2021-03-09 | Discharge: 2021-03-09 | Disposition: A | Discharge status: Home / Self Care | Payer: Medicare HMO | Source: Ambulatory Visit | Attending: Obstetrics and Gynecology | Admitting: Obstetrics and Gynecology

## 2021-03-09 DIAGNOSIS — E2839 Other primary ovarian failure: Secondary | ICD-10-CM

## 2021-03-09 DIAGNOSIS — Z780 Asymptomatic menopausal state: Secondary | ICD-10-CM | POA: Diagnosis not present

## 2021-04-14 ENCOUNTER — Encounter: Payer: Self-pay | Admitting: Nurse Practitioner

## 2021-04-14 ENCOUNTER — Ambulatory Visit (INDEPENDENT_AMBULATORY_CARE_PROVIDER_SITE_OTHER): Payer: Medicare HMO | Admitting: Nurse Practitioner

## 2021-04-14 VITALS — BP 138/80 | HR 83 | Temp 98.3°F | Resp 20 | Ht 65.0 in | Wt 181.1 lb

## 2021-04-14 DIAGNOSIS — B9689 Other specified bacterial agents as the cause of diseases classified elsewhere: Secondary | ICD-10-CM | POA: Diagnosis not present

## 2021-04-14 DIAGNOSIS — H109 Unspecified conjunctivitis: Secondary | ICD-10-CM

## 2021-04-14 MED ORDER — BACITRACIN-POLYMYXIN B 500-10000 UNIT/GM OP OINT: 1.0000 "application " | TOPICAL_OINTMENT | Freq: Two times a day (BID) | OPHTHALMIC | 0 refills | Status: DC

## 2021-04-14 NOTE — Progress Notes (Signed)
Acute Office Visit  Subjective:    Patient ID: Rebecca Gibson, female    DOB: 08/15/1955, 66 y.o.   MRN: [id number]  Chief Complaint  Patient presents with   Conjunctivitis    Conjunctivitis  The current episode started 2 days ago. The onset was gradual. The problem has been gradually worsening. The problem is moderate. Nothing relieves the symptoms. Associated symptoms include eye itching, ear discharge, eye discharge and eye redness. Pertinent negatives include no fever, no decreased vision, no congestion, no ear pain, no hearing loss and no rash. The eye pain is moderate. She has been Behaving normally.    Past Medical History:  Diagnosis Date   Hot flashes, menopausal    treatment with lexapro   SVD (spontaneous vaginal delivery)    x 2    Past Surgical History:  Procedure Laterality Date   APPENDECTOMY     CHOLECYSTECTOMY     HYSTEROSCOPY WITH D & C  03/07/2012   Procedure: DILATATION AND CURETTAGE /HYSTEROSCOPY;  Surgeon: Allyn Kenner, DO;  Location: [facility];  Service: Gynecology;  Laterality: N/A;   KNEE SURGERY     right    Family History  Problem Relation Age of Onset   Hypertension Father     Social History   Socioeconomic History   Marital status: Single    Spouse name: Not on file   Number of children: 2   Years of education: Not on file   Highest education level: Not on file  Occupational History    Employer: [employer]   Tobacco Use   Smoking status: Former    Packs/day: 0.50    Years: 35.00    Pack years: 17.50    Types: Cigarettes    Quit date: 03/16/2012    Years since quitting: 9.0   Smokeless tobacco: Never  Substance and Sexual Activity   Alcohol use: Yes    Comment: socially   Drug use: No   Sexual activity: Yes    Birth control/protection: Post-menopausal  Other Topics Concern   Not on file  Social History Narrative   Lives at home with youngest son.      Social Determinants of Health   Financial Resource Strain: Not  on file  Food Insecurity: Not on file  Transportation Needs: Not on file  Physical Activity: Not on file  Stress: Not on file  Social Connections: Not on file  Intimate Partner Violence: Not on file    Outpatient Medications Prior to Visit  Medication Sig Dispense Refill   rosuvastatin (CRESTOR) 5 MG tablet Take 1 tablet (5 mg total) by mouth daily. 90 tablet 3   venlafaxine XR (EFFEXOR-XR) 75 MG 24 hr capsule      No facility-administered medications prior to visit.    Allergies  Allergen Reactions   Codeine Nausea Only   Ivp Dye [Iodinated Contrast Media] Hives and Swelling    Review of Systems  Constitutional:  Negative for fever.  HENT:  Positive for ear discharge. Negative for congestion, ear pain and hearing loss.   Eyes:  Positive for discharge, redness and itching.  Cardiovascular: Negative.   Gastrointestinal: Negative.   Skin: Negative.  Negative for rash.  All other systems reviewed and are negative.     Objective:    Physical Exam Vitals and nursing note reviewed.  Constitutional:      General: She is awake.     Appearance: She is overweight.  HENT:     Head: Normocephalic.  Right Ear: External ear normal.     Left Ear: External ear normal.     Mouth/Throat:     Mouth: Mucous membranes are moist.     Pharynx: Oropharynx is clear.  Eyes:     Conjunctiva/sclera: Conjunctivae normal.  Cardiovascular:     Rate and Rhythm: Normal rate and regular rhythm.     Pulses: Normal pulses.     Heart sounds: Normal heart sounds.  Pulmonary:     Effort: Pulmonary effort is normal.     Breath sounds: Normal breath sounds.  Abdominal:     General: Bowel sounds are normal.  Skin:    General: Skin is warm.     Findings: No rash.  Neurological:     General: No focal deficit present.     Mental Status: She is alert and oriented to person, place, and time.    BP 138/80    Pulse 83    Temp 98.3 F (36.8 C) (Oral)    Resp 20    Ht $R'5\' 5"'Fa$  (1.651 m)    Wt 181 lb  1 oz (82.1 kg)    SpO2 99%    BMI 30.13 kg/m  Wt Readings from Last 3 Encounters:  04/14/21 181 lb 1 oz (82.1 kg)  07/22/20 207 lb (93.9 kg)  04/14/18 186 lb (84.4 kg)    Health Maintenance Due  Topic Date Due   COVID-19 Vaccine (1) Never done   COLONOSCOPY (Pts 45-70yrs Insurance coverage will need to be confirmed)  Never done   Pneumonia Vaccine 59+ Years old (2 - PPSV23 if available, else PCV20) 04/15/2019   MAMMOGRAM  12/11/2019    There are no preventive care reminders to display for this patient.   Lab Results  Component Value Date   TSH 3.680 07/22/2020   Lab Results  Component Value Date   WBC 10.7 07/22/2020   HGB 15.6 07/22/2020   HCT 46.2 07/22/2020   MCV 91 07/22/2020   PLT 253 07/22/2020   Lab Results  Component Value Date   NA 143 02/15/2021   K 4.5 02/15/2021   CO2 27 02/15/2021   GLUCOSE 100 (H) 02/15/2021   BUN 18 02/15/2021   CREATININE 0.67 02/15/2021   BILITOT 0.3 02/15/2021   ALKPHOS 98 02/15/2021   AST 14 02/15/2021   ALT 8 02/15/2021   PROT 6.7 02/15/2021   ALBUMIN 4.4 02/15/2021   CALCIUM 9.2 02/15/2021   EGFR 97 02/15/2021   Lab Results  Component Value Date   CHOL 140 02/15/2021   Lab Results  Component Value Date   HDL 42 02/15/2021   Lab Results  Component Value Date   LDLCALC 74 02/15/2021   Lab Results  Component Value Date   TRIG 138 02/15/2021   Lab Results  Component Value Date   CHOLHDL 3.3 02/15/2021   Lab Results  Component Value Date   HGBA1C 5.9 (H) 02/15/2021       Assessment & Plan:  Patient exposed to sick grandchildren with flulike symptoms.  Patient is reporting worsening, bilateral eye pain, discharge and blurry vision. Patient presents as conjunctivitis. Provided patient with education to keep hands clean, warm compress with warm washcloth, Tylenol/ibuprofen for pain and fever as needed. Polysporin ophthalmic ointment twice daily.  Follow-up with worsening or unresolved symptoms. Problem List  Items Addressed This Visit   None Visit Diagnoses     Bacterial conjunctivitis of both eyes    -  Primary   Relevant Medications   bacitracin-polymyxin  b (POLYSPORIN) ophthalmic ointment        Meds ordered this encounter  Medications   bacitracin-polymyxin b (POLYSPORIN) ophthalmic ointment    Sig: Place 1 application into the right eye 2 (two) times daily. apply to eye every 12 hours while awake    Dispense:  7 g    Refill:  0    Order Specific Question:   Supervising Provider    Answer:   Claretta Fraise [[id number]]     Ivy Lynn, NP

## 2021-04-14 NOTE — Patient Instructions (Signed)
Bacterial Conjunctivitis, Adult °Bacterial conjunctivitis is an infection of the clear membrane that covers the white part of the eye and the inner surface of the eyelid (conjunctiva). When the blood vessels in the conjunctiva become inflamed, the eye becomes red or pink. The eye often feels irritated or itchy. Bacterial conjunctivitis spreads easily from person to person (is contagious). It also spreads easily from one eye to the other eye. °What are the causes? °This condition is caused by bacteria. You may get the infection if you come into close contact with: °A person who is infected with the bacteria. °Items that are contaminated with the bacteria, such as a face towel, contact lens solution, or eye makeup. °What increases the risk? °You are more likely to develop this condition if: °You are exposed to other people who have the infection. °You wear contact lenses. °You have a sinus infection. °You have had a recent eye injury or surgery. °You have a weak body defense system (immune system). °You have a medical condition that causes dry eyes. °What are the signs or symptoms? °Symptoms of this condition include: °Thick, yellowish discharge from the eye. This may turn into a crust on the eyelid overnight and cause your eyelids to stick together. °Tearing or watery eyes. °Itchy eyes. °Burning feeling in your eyes. °Eye redness. °Swollen eyelids. °Blurred vision. °How is this diagnosed? °This condition is diagnosed based on your symptoms and medical history. Your health care provider may also take a sample of discharge from your eye to find the cause of your infection. °How is this treated? °This condition may be treated with: °Antibiotic eye drops or ointment to clear the infection more quickly and prevent the spread of infection to others. °Antibiotic medicines taken by mouth (orally) to treat infections that do not respond to drops or ointments or that last longer than 10 days. °Cool, wet cloths (cool  compresses) placed on the eyes. °Artificial tears applied 2-6 times a day. °Follow these instructions at home: °Medicines °Take or apply your antibiotic medicine as told by your health care provider. Do not stop using the antibiotic, even if your condition improves, unless directed by your health care provider. °Take or apply over-the-counter and prescription medicines only as told by your health care provider. °Be very careful to avoid touching the edge of your eyelid with the eye-drop bottle or the ointment tube when you apply medicines to the affected eye. This will keep you from spreading the infection to your other eye or to other people. °Managing discomfort °Gently wipe away any drainage from your eye with a warm, wet washcloth or a cotton ball. °Apply a clean, cool compress to your eye for 10-20 minutes, 3-4 times a day. °General instructions °Do not wear contact lenses until the inflammation is gone and your health care provider says it is safe to wear them again. Ask your health care provider how to sterilize or replace your contact lenses before you use them again. Wear glasses until you can resume wearing contact lenses. °Avoid wearing eye makeup until the inflammation is gone. Throw away any old eye cosmetics that may be contaminated. °Change or wash your pillowcase every day. °Do not share towels or washcloths. This may spread the infection. °Wash your hands often with soap and water for at least 20 seconds and especially before touching your face or eyes. Use paper towels to dry your hands. °Avoid touching or rubbing your eyes. °Do not drive or use heavy machinery if your vision is blurred. °Contact   a health care provider if: °You have a fever. °Your symptoms do not get better after 10 days. °Get help right away if: °You have a fever and your symptoms suddenly get worse. °You have severe pain when you move your eye. °You have facial pain, redness, or swelling. °You have a sudden loss of  vision. °Summary °Bacterial conjunctivitis is an infection of the clear membrane that covers the white part of the eye and the inner surface of the eyelid (conjunctiva). °Bacterial conjunctivitis spreads easily from eye to eye and from person to person (is contagious). °Wash your hands often with soap and water for at least 20 seconds and especially before touching your face or eyes. Use paper towels to dry your hands. °Take or apply your antibiotic medicine as told by your health care provider. Do not stop using the antibiotic even if your condition improves. °Contact a health care provider if you have a fever or if your symptoms do not get better after 10 days. Get help right away if you have a sudden loss of vision. °This information is not intended to replace advice given to you by your health care provider. Make sure you discuss any questions you have with your health care provider. °Document Revised: 06/22/2020 Document Reviewed: 06/22/2020 °Elsevier Patient Education © 2022 [employer] ° °

## 2021-07-10 DIAGNOSIS — Z0100 Encounter for examination of eyes and vision without abnormal findings: Secondary | ICD-10-CM | POA: Diagnosis not present

## 2021-07-10 DIAGNOSIS — H5210 Myopia, unspecified eye: Secondary | ICD-10-CM | POA: Diagnosis not present

## 2021-08-02 ENCOUNTER — Ambulatory Visit (INDEPENDENT_AMBULATORY_CARE_PROVIDER_SITE_OTHER): Payer: Medicare HMO | Admitting: Family Medicine

## 2021-08-02 ENCOUNTER — Encounter: Payer: Self-pay | Admitting: Family Medicine

## 2021-08-02 VITALS — BP 109/73 | HR 93 | Temp 97.6°F | Ht 65.0 in | Wt 183.6 lb

## 2021-08-02 DIAGNOSIS — R7303 Prediabetes: Secondary | ICD-10-CM | POA: Diagnosis not present

## 2021-08-02 DIAGNOSIS — Z9189 Other specified personal risk factors, not elsewhere classified: Secondary | ICD-10-CM

## 2021-08-02 DIAGNOSIS — Z0000 Encounter for general adult medical examination without abnormal findings: Secondary | ICD-10-CM

## 2021-08-02 DIAGNOSIS — E669 Obesity, unspecified: Secondary | ICD-10-CM

## 2021-08-02 DIAGNOSIS — G629 Polyneuropathy, unspecified: Secondary | ICD-10-CM

## 2021-08-02 DIAGNOSIS — Z789 Other specified health status: Secondary | ICD-10-CM | POA: Insufficient documentation

## 2021-08-02 DIAGNOSIS — Z0001 Encounter for general adult medical examination with abnormal findings: Secondary | ICD-10-CM

## 2021-08-02 LAB — BAYER DCA HB A1C WAIVED: HB A1C (BAYER DCA - WAIVED): 5.4 % (ref 4.8–5.6)

## 2021-08-02 NOTE — Patient Instructions (Signed)
Tumeric is a natural antiinflammatory ? ?Thank you for coming in today for your Annual Medicare Wellness Visit.  Things that we discussed today are included in this packet. ? ?Create and/or bring a copy of your Living Will/ Advanced Directive into the office so that we may respect your wishes should an emergency occur. ? ?Get the recommended life-saving vaccines we discussed today. ? ?Get your mammogram/ colonoscopy/ DEXA scans as directed by your provider. ? ?Make sure that your medications are organized and safely stored.  Remember to always ask for help if you forget when/ how to take your medications. ? ?Make healthy food choices (Rich in fruits/ veggies/ lean meats and low in salt, sugar and fat) ? ?Do something that you enjoy for at least 30 minutes every day to stay active (walking, gardening, swimming, etc). This will help you lower your risk of falls/ broken bones. ? ?Be social, do puzzles/ crosswords.  These things help the mind stay young and lower your risk of developing dementia. ? ?Make sure that your home is safe by checking your smoke detectors regularly and doing the things outlined below to lower your risk of falls. ? ? ? ?

## 2021-08-02 NOTE — Progress Notes (Signed)
? ?Subjective:  ? ? Rebecca Gibson is a 66 y.o. female who presents for a Welcome to Medicare exam.  ? ?Review of Systems ?Had colonoscopy done in 2020 in [place] and will have 5-year follow-up due to colonic polyps noted.  No reports of rectal bleeding or other GI concerns.  Has a spot on one of her retina and she will be followed closely by ophthalmology for this.  Does suffer from some neuropathy along feet but thinks this may have been secondary to previous injury. ? ?Cardiac Risk Factors include: advanced age (>89mn, >>57women);dyslipidemia;obesity (BMI >30kg/m2) ? ?    ?Objective:  ?  ?Today's Vitals  ? 08/02/21 0859  ?BP: 109/73  ?Pulse: 93  ?Temp: 97.6 ?F (36.4 ?C)  ?SpO2: 94%  ?Weight: 183 lb 9.6 oz (83.3 kg)  ?Height: _0  (1.651 m)  ?Body mass index is 30.55 kg/m?. ? ?Medications ?Outpatient Encounter Medications as of 08/02/2021  ?Medication Sig  ? rosuvastatin (CRESTOR) 5 MG tablet Take 1 tablet (5 mg total) by mouth daily.  ? [DISCONTINUED] bacitracin-polymyxin b (POLYSPORIN) ophthalmic ointment Place 1 application into the right eye 2 (two) times daily. apply to eye every 12 hours while awake  ? [DISCONTINUED] venlafaxine XR (EFFEXOR-XR) 75 MG 24 hr capsule   ? ?No facility-administered encounter medications on file as of 08/02/2021.  ?  ? ?History: ?Past Medical History:  ?Diagnosis Date  ? Hot flashes, menopausal   ? treatment with lexapro  ? SVD (spontaneous vaginal delivery)   ? x 2  ? ?Past Surgical History:  ?Procedure Laterality Date  ? APPENDECTOMY    ? CHOLECYSTECTOMY    ? HYSTEROSCOPY WITH D & C  03/07/2012  ? Procedure: DILATATION AND CURETTAGE /HYSTEROSCOPY;  Surgeon: SAllyn Kenner DO;  [facility];  Service: Gynecology;  Laterality: N/A;  ? KNEE SURGERY    ? right  ?  ?Family History  ?Problem Relation Age of Onset  ? Hypertension Father   ? ?Social History  ? ?Occupational History  ?  Employer: [employer]   ?Tobacco Use  ? Smoking status: Former  ?  Packs/day: 0.50  ?   Years: 35.00  ?  Pack years: 17.50  ?  Types: Cigarettes  ?  Quit date: 03/16/2012  ?  Years since quitting: 9.3  ? Smokeless tobacco: Never  ?Substance and Sexual Activity  ? Alcohol use: Yes  ?  Comment: socially  ? Drug use: No  ? Sexual activity: Yes  ?  Birth control/protection: Post-menopausal  ? ? ?Tobacco Counseling ?Counseling given: Not Answered ? ? ?Immunizations and Health Maintenance ?Immunization History  ?Administered Date(s) Administered  ? Pneumococcal Conjugate-13 04/14/2018  ? Zoster Recombinat (Shingrix) 04/14/2018, 10/02/2018  ? ?Health Maintenance Due  ?Topic Date Due  ? COLONOSCOPY (Pts [phone] coverage will need to be confirmed)  Never done  ? MAMMOGRAM  12/11/2019  ? ? ?Activities of Daily Living ? ?  08/02/2021  ?  1:04 PM  ?In your present state of health, do you have any difficulty performing the following activities:  ?Hearing? 0  ?Vision? 0  ?Difficulty concentrating or making decisions? 0  ?Walking or climbing stairs? 0  ?Dressing or bathing? 0  ?Doing errands, shopping? 0  ?Preparing Food and eating ? N  ?Using the Toilet? N  ?In the past six months, have you accidently leaked urine? N  ?Do you have problems with loss of bowel control? N  ?Managing your Medications? N  ?Managing your Finances? N  ?  Housekeeping or managing your Housekeeping? N  ? ? ?Physical Exam   ?Gen: Well-appearing female in no acute distress ?HEENT: Sclera white.  TMs intact.  Oropharynx without masses or erythema.  Moist mucous membranes. ?Cardio: Regular rate and rhythm.  No murmurs ?Pulmonary: Clear to auscultation bilaterally.  Normal work of breathing on room air ?GI: Soft, nontender, nondistended.  No hepatosplenomegaly ?MSK: Ambulating independently.  Normal tone.  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 ?Neuro: Vibratory sensation intact. ?Psych: Mood stable, speech normal, after appropriate ? ?Advanced Directives: ?Does Patient Have a Medical Advance Directive?:  No ?Would patient like information on creating a medical advance directive?: Yes (MAU/Ambulatory/Procedural Areas - Information given) ?   ?Assessment:  ?  ?This is a routine wellness examination for this patient .  ? ?Vision/Hearing screen ?No results found. ? ?Dietary issues and exercise activities discussed:  ?Current Exercise Habits: Structured exercise class, Time (Minutes): 40, Frequency (Times/Week): 2, Weekly Exercise (Minutes/Week): 80, Intensity: Moderate, Exercise limited by: None identified ? ? Goals   ? ?  Weight (lb) < 175 lb (79.4 kg)   ? ?  ? ?Depression Screen ? ?  08/02/2021  ?  9:27 AM 04/14/2021  ? 12:49 PM 07/22/2020  ? 11:17 AM 04/14/2018  ?  3:45 PM  ?PHQ 2/9 Scores  ?PHQ - 2 Score 0 0 0 0  ?PHQ- 9 Score  0  0  ?  ? ?Fall Risk ? ?  08/02/2021  ?  9:27 AM  ?Fall Risk   ?Falls in the past year? 0  ? ? ?Cognitive Function: ? ?  08/02/2021  ?  9:27 AM  ?MMSE - Mini Mental State Exam  ?Orientation to time 5  ?Orientation to Place 5  ?Registration 3  ?Attention/ Calculation 5  ?Recall 3  ?Language- name 2 objects 2  ?Language- repeat 1  ?Language- follow 3 step command 3  ?Language- read & follow direction 1  ?Write a sentence 1  ?Copy design 1  ?Total score 30  ? ?  ?  ? ?Patient Care Team: ?Janora Norlander, DO as PCP - General (Family Medicine) ?Ob/Gyn, [facility] ? ?   ?Plan:  ? Welcome to Medicare preventive visit - Plan: EKG 12-Lead ? ?Full code status ? ?Risk for coronary artery disease less than 10% in next 10 years per Framingham score - Plan: Lipid panel, EKG 12-Lead, CMP14+EGFR, CMP14+EGFR, Lipid panel ? ?Obesity (BMI 30-39.9) - Plan: Lipid panel, Bayer DCA Hb A1c Waived, EKG 12-Lead, CMP14+EGFR, CMP14+EGFR, Bayer DCA Hb A1c Waived, Lipid panel ? ?Pre-diabetes - Plan: Bayer DCA Hb A1c Waived, CMP14+EGFR, CMP14+EGFR, Bayer DCA Hb A1c Waived ? ?Neuropathy - Plan: Vitamin B12, Vitamin B12 ? ?I reviewed patient's CODE STATUS which is a full code today.  She will get advanced directive to [place]  when she has reviewed and had this notarized ? ?EKG obtained today which demonstrated no ischemic changes ? ?Fasting lipid, CMP ordered.  A1c collected which did not demonstrate diabetes or prediabetes today. ? ?Uncertain etiology of neuropathy but may be secondary to history of nerve damage.  We will check B12 ? ?I have personally reviewed and noted the following in the patient?s chart:  ? ?Medical and social history ?Use of alcohol, tobacco or illicit drugs  ?Current medications and supplements ?Functional ability and status ?Nutritional status ?Physical activity ?Advanced directives ?List of other physicians ?Hospitalizations, surgeries, and ER visits in previous 12 months ?Vitals ?Screenings to include cognitive, depression,  and falls ?Referrals and appointments ? ?In addition, I have reviewed and discussed with patient certain preventive protocols, quality metrics, and best practice recommendations. A written personalized care plan for preventive services as well as general preventive health recommendations were provided to patient. ? ?  ? ?Ronnie Doss, DO 08/02/2021 ? ? ? ? ?

## 2021-08-03 LAB — CMP14+EGFR
ALT: 10 IU/L (ref 0–32)
AST: 14 IU/L (ref 0–40)
Albumin/Globulin Ratio: 1.6 (ref 1.2–2.2)
Albumin: 4.2 g/dL (ref 3.8–4.8)
Alkaline Phosphatase: 95 IU/L (ref 44–121)
BUN/Creatinine Ratio: 25 (ref 12–28)
BUN: 16 mg/dL (ref 8–27)
Bilirubin Total: 0.6 mg/dL (ref 0.0–1.2)
CO2: 23 mmol/L (ref 20–29)
Calcium: 9.3 mg/dL (ref 8.7–10.3)
Chloride: 105 mmol/L (ref 96–106)
Creatinine, Ser: 0.65 mg/dL (ref 0.57–1.00)
Globulin, Total: 2.7 g/dL (ref 1.5–4.5)
Glucose: 102 mg/dL — ABNORMAL HIGH (ref 70–99)
Potassium: 4.5 mmol/L (ref 3.5–5.2)
Sodium: 141 mmol/L (ref 134–144)
Total Protein: 6.9 g/dL (ref 6.0–8.5)
eGFR: 98 mL/min/{1.73_m2} (ref >59)

## 2021-08-03 LAB — LIPID PANEL
Chol/HDL Ratio: 3.1 ratio (ref 0.0–4.4)
Cholesterol, Total: 127 mg/dL (ref 100–199)
HDL: 41 mg/dL (ref >39)
LDL Chol Calc (NIH): 61 mg/dL (ref 0–99)
Triglycerides: 144 mg/dL (ref 0–149)
VLDL Cholesterol Cal: 25 mg/dL (ref 5–40)

## 2021-08-03 LAB — VITAMIN B12: Vitamin B-12: 1046 pg/mL (ref 232–1245)

## 2021-08-21 ENCOUNTER — Other Ambulatory Visit: Payer: Self-pay | Admitting: Family Medicine

## 2021-09-23 ENCOUNTER — Other Ambulatory Visit: Payer: Self-pay | Admitting: Family Medicine

## 2022-01-12 DIAGNOSIS — B349 Viral infection, unspecified: Secondary | ICD-10-CM | POA: Diagnosis not present

## 2022-01-12 DIAGNOSIS — R070 Pain in throat: Secondary | ICD-10-CM | POA: Diagnosis not present

## 2022-01-12 DIAGNOSIS — Z20822 Contact with and (suspected) exposure to covid-19: Secondary | ICD-10-CM | POA: Diagnosis not present

## 2022-03-07 DIAGNOSIS — Z01419 Encounter for gynecological examination (general) (routine) without abnormal findings: Secondary | ICD-10-CM | POA: Diagnosis not present

## 2022-03-07 DIAGNOSIS — Z1231 Encounter for screening mammogram for malignant neoplasm of breast: Secondary | ICD-10-CM | POA: Diagnosis not present

## 2022-04-28 DIAGNOSIS — J988 Other specified respiratory disorders: Secondary | ICD-10-CM | POA: Diagnosis not present

## 2022-04-28 DIAGNOSIS — B9689 Other specified bacterial agents as the cause of diseases classified elsewhere: Secondary | ICD-10-CM | POA: Diagnosis not present

## 2022-04-28 DIAGNOSIS — J0140 Acute pansinusitis, unspecified: Secondary | ICD-10-CM | POA: Diagnosis not present

## 2022-05-11 DIAGNOSIS — Z08 Encounter for follow-up examination after completed treatment for malignant neoplasm: Secondary | ICD-10-CM | POA: Diagnosis not present

## 2022-05-11 DIAGNOSIS — L905 Scar conditions and fibrosis of skin: Secondary | ICD-10-CM | POA: Diagnosis not present

## 2022-05-11 DIAGNOSIS — B079 Viral wart, unspecified: Secondary | ICD-10-CM | POA: Diagnosis not present

## 2022-05-11 DIAGNOSIS — L570 Actinic keratosis: Secondary | ICD-10-CM | POA: Diagnosis not present

## 2022-05-11 DIAGNOSIS — L821 Other seborrheic keratosis: Secondary | ICD-10-CM | POA: Diagnosis not present

## 2022-05-11 DIAGNOSIS — D225 Melanocytic nevi of trunk: Secondary | ICD-10-CM | POA: Diagnosis not present

## 2022-05-11 DIAGNOSIS — L814 Other melanin hyperpigmentation: Secondary | ICD-10-CM | POA: Diagnosis not present

## 2022-05-11 DIAGNOSIS — Z85828 Personal history of other malignant neoplasm of skin: Secondary | ICD-10-CM | POA: Diagnosis not present

## 2022-05-11 DIAGNOSIS — L308 Other specified dermatitis: Secondary | ICD-10-CM | POA: Diagnosis not present

## 2022-05-11 DIAGNOSIS — D485 Neoplasm of uncertain behavior of skin: Secondary | ICD-10-CM | POA: Diagnosis not present

## 2022-06-15 ENCOUNTER — Telehealth: Payer: Self-pay | Admitting: Family Medicine

## 2022-06-15 ENCOUNTER — Other Ambulatory Visit: Payer: Self-pay

## 2022-06-15 DIAGNOSIS — E7800 Pure hypercholesterolemia, unspecified: Secondary | ICD-10-CM

## 2022-06-15 DIAGNOSIS — E669 Obesity, unspecified: Secondary | ICD-10-CM

## 2022-06-15 DIAGNOSIS — R7303 Prediabetes: Secondary | ICD-10-CM

## 2022-06-15 NOTE — Telephone Encounter (Signed)
Labs added.

## 2022-07-12 DIAGNOSIS — H52223 Regular astigmatism, bilateral: Secondary | ICD-10-CM | POA: Diagnosis not present

## 2022-07-12 DIAGNOSIS — H35363 Drusen (degenerative) of macula, bilateral: Secondary | ICD-10-CM | POA: Diagnosis not present

## 2022-07-12 DIAGNOSIS — D23122 Other benign neoplasm of skin of left lower eyelid, including canthus: Secondary | ICD-10-CM | POA: Diagnosis not present

## 2022-07-12 DIAGNOSIS — H524 Presbyopia: Secondary | ICD-10-CM | POA: Diagnosis not present

## 2022-07-12 DIAGNOSIS — H2513 Age-related nuclear cataract, bilateral: Secondary | ICD-10-CM | POA: Diagnosis not present

## 2022-07-12 DIAGNOSIS — H5203 Hypermetropia, bilateral: Secondary | ICD-10-CM | POA: Diagnosis not present

## 2022-07-31 DIAGNOSIS — D485 Neoplasm of uncertain behavior of skin: Secondary | ICD-10-CM | POA: Diagnosis not present

## 2022-07-31 DIAGNOSIS — L989 Disorder of the skin and subcutaneous tissue, unspecified: Secondary | ICD-10-CM | POA: Diagnosis not present

## 2022-08-07 ENCOUNTER — Ambulatory Visit (INDEPENDENT_AMBULATORY_CARE_PROVIDER_SITE_OTHER): Payer: Medicare HMO

## 2022-08-07 VITALS — Ht 65.0 in | Wt 188.0 lb

## 2022-08-07 DIAGNOSIS — Z0000 Encounter for general adult medical examination without abnormal findings: Secondary | ICD-10-CM | POA: Diagnosis not present

## 2022-08-07 NOTE — Progress Notes (Signed)
Subjective:   Rebecca Gibson is a 67 y.o. female who presents for an Initial Medicare Annual Wellness Visit. I connected with  Rebecca Gibson on 08/07/22 by a audio enabled telemedicine application and verified that I am speaking with the correct person using two identifiers.  Patient Location: Home  Provider Location: Home Office  I discussed the limitations of evaluation and management by telemedicine. The patient expressed understanding and agreed to proceed.  Review of Systems     Cardiac Risk Factors include: advanced age (>109men, >41 women);dyslipidemia     Objective:    Today's Vitals   08/07/22 1117  Weight: 188 lb (85.3 kg)  Height: 5\' 5"  (1.651 m)   Body mass index is 31.28 kg/m.     08/07/2022   11:21 AM 08/02/2021    1:04 PM  Advanced Directives  Does Patient Have a Medical Advance Directive? No No  Would patient like information on creating a medical advance directive? No - Patient declined Yes (MAU/Ambulatory/Procedural Areas - Information given)    Current Medications (verified) Outpatient Encounter Medications as of 08/07/2022  Medication Sig   rosuvastatin (CRESTOR) 5 MG tablet TAKE 1 TABLET (5 MG TOTAL) BY MOUTH DAILY.   No facility-administered encounter medications on file as of 08/07/2022.    Allergies (verified) Codeine and Ivp dye [iodinated contrast media]   History: Past Medical History:  Diagnosis Date   Hot flashes, menopausal    treatment with lexapro   SVD (spontaneous vaginal delivery)    x 2   Past Surgical History:  Procedure Laterality Date   APPENDECTOMY     CHOLECYSTECTOMY     HYSTEROSCOPY WITH D & C  03/07/2012   Procedure: DILATATION AND CURETTAGE /HYSTEROSCOPY;  Surgeon: Philip Aspen, DO;  Location: [facility];  Service: Gynecology;  Laterality: N/A;   KNEE SURGERY     right   Family History  Problem Relation Age of Onset   Hypertension Father    Social History   Socioeconomic History   Marital status: Divorced     Spouse name: Not on file   Number of children: 2   Years of education: Not on file   Highest education level: Not on file  Occupational History    Employer: [employer]   Tobacco Use   Smoking status: Former    Packs/day: 0.50    Years: 35.00    Additional pack years: 0.00    Total pack years: 17.50    Types: Cigarettes    Quit date: 03/16/2012    Years since quitting: 10.4   Smokeless tobacco: Never  Substance and Sexual Activity   Alcohol use: Yes    Comment: socially   Drug use: No   Sexual activity: Yes    Birth control/protection: Post-menopausal  Other Topics Concern   Not on file  Social History Narrative   Lives at home with youngest son.      Social Determinants of Health   Financial Resource Strain: Low Risk  (08/07/2022)   Overall Financial Resource Strain (CARDIA)    Difficulty of Paying Living Expenses: Not hard at all  Food Insecurity: No Food Insecurity (08/07/2022)   Hunger Vital Sign    Worried About Running Out of Food in the Last Year: Never true    Ran Out of Food in the Last Year: Never true  Transportation Needs: No Transportation Needs (08/07/2022)   PRAPARE - Administrator, Civil Service (Medical): No    Lack of Transportation (Non-Medical): No  Physical Activity: Insufficiently Active (08/07/2022)   Exercise Vital Sign    Days of Exercise per Week: 3 days    Minutes of Exercise per Session: 30 min  Stress: No Stress Concern Present (08/07/2022)   [employer] of Occupational Health - Occupational Stress Questionnaire    Feeling of Stress : Not at all  Social Connections: Moderately Isolated (08/07/2022)   Social Connection and Isolation Panel [NHANES]    Frequency of Communication with Friends and Family: More than three times a week    Frequency of Social Gatherings with Friends and Family: More than three times a week    Attends Religious Services: Never    Database administrator or Organizations: Yes    Attends Museum/gallery exhibitions officer: More than 4 times per year    Marital Status: Divorced    Tobacco Counseling Counseling given: Not Answered   Clinical Intake:  Pre-visit preparation completed: Yes  Pain : No/denies pain     Nutritional Risks: None Diabetes: No  How often do you need to have someone help you when you read instructions, pamphlets, or other written materials from your doctor or pharmacy?: 1 - Never  Diabetic?no   Interpreter Needed?: No  Information entered by :: Renie Ora, LPN   Activities of Daily Living    08/07/2022   11:22 AM 08/04/2022    8:36 PM  In your present state of health, do you have any difficulty performing the following activities:  Hearing? 0 0  Vision? 0 0  Difficulty concentrating or making decisions? 0 0  Walking or climbing stairs? 0 0  Dressing or bathing? 0 0  Doing errands, shopping? 0 0  Preparing Food and eating ? N N  Using the Toilet? N N  In the past six months, have you accidently leaked urine? N N  Do you have problems with loss of bowel control? N N  Managing your Medications? N N  Managing your Finances? N N  Housekeeping or managing your Housekeeping? N N    Patient Care Team: Raliegh Ip, DO as PCP - General (Family Medicine) Ob/Gyn, [facility] any recent Medical Services you may have received from other than [facility] providers in the past year (date may be approximate).     Assessment:   This is a routine wellness examination for Rebecca Gibson.  Hearing/Vision screen Vision Screening - Comments:: Wears rx glasses - up to date with routine eye exams with  Dr.Johnson   Dietary issues and exercise activities discussed: Current Exercise Habits: Home exercise routine, Type of exercise: walking, Time (Minutes): 30, Frequency (Times/Week): 3, Weekly Exercise (Minutes/Week): 90, Intensity: Mild, Exercise limited by: None identified   Goals Addressed             This Visit's Progress    Exercise 3x per  week (30 min per time)         Depression Screen    08/07/2022   11:20 AM 08/02/2021    9:27 AM 04/14/2021   12:49 PM 07/22/2020   11:17 AM 04/14/2018    3:45 PM 04/13/2015    9:27 AM 06/30/2013    9:20 AM  PHQ 2/9 Scores  PHQ - 2 Score 0 0 0 0 0 0 0  PHQ- 9 Score   0  0      Fall Risk    08/07/2022   11:19 AM 08/04/2022    8:36 PM 08/02/2021    9:27 AM 04/14/2021   12:49  PM 07/22/2020   11:17 AM  Fall Risk   Falls in the past year? 0 0 0 1 0  Number falls in past yr: 0   0   Injury with Fall? 0   1   Risk for fall due to : No Fall Risks   History of fall(s)   Follow up Falls prevention discussed   Falls evaluation completed     FALL RISK PREVENTION PERTAINING TO THE HOME:  Any stairs in or around the home? No  If so, are there any without handrails? No  Home free of loose throw rugs in walkways, pet beds, electrical cords, etc? Yes  Adequate lighting in your home to reduce risk of falls? Yes   ASSISTIVE DEVICES UTILIZED TO PREVENT FALLS:  Life alert? No  Use of a cane, walker or w/c? No  Grab bars in the bathroom? Yes  Shower chair or bench in shower? No  Elevated toilet seat or a handicapped toilet? No       08/02/2021    9:27 AM  MMSE - Mini Mental State Exam  Orientation to time 5  Orientation to Place 5  Registration 3  Attention/ Calculation 5  Recall 3  Language- name 2 objects 2  Language- repeat 1  Language- follow 3 step command 3  Language- read & follow direction 1  Write a sentence 1  Copy design 1  Total score 30        08/07/2022   11:22 AM  6CIT Screen  What Year? 0 points  What month? 0 points  What time? 0 points  Count back from 20 0 points  Months in reverse 0 points  Repeat phrase 0 points  Total Score 0 points    Immunizations Immunization History  Administered Date(s) Administered   Pneumococcal Conjugate-13 04/14/2018   Zoster Recombinat (Shingrix) 04/14/2018, 10/02/2018    TDAP status: Due, Education has been provided  regarding the importance of this vaccine. Advised may receive this vaccine at local pharmacy or Health Dept. Aware to provide a copy of the vaccination record if obtained from local pharmacy or Health Dept. Verbalized acceptance and understanding.  Flu Vaccine status: Declined, Education has been provided regarding the importance of this vaccine but patient still declined. Advised may receive this vaccine at local pharmacy or Health Dept. Aware to provide a copy of the vaccination record if obtained from local pharmacy or Health Dept. Verbalized acceptance and understanding.  Pneumococcal vaccine status: Up to date  Covid-19 vaccine status: Declined, Education has been provided regarding the importance of this vaccine but patient still declined. Advised may receive this vaccine at local pharmacy or Health Dept.or vaccine clinic. Aware to provide a copy of the vaccination record if obtained from local pharmacy or Health Dept. Verbalized acceptance and understanding.  Qualifies for Shingles Vaccine? Yes   Zostavax completed Yes   Shingrix Completed?: Yes  Screening Tests Health Maintenance  Topic Date Due   COVID-19 Vaccine (1) Never done   DTaP/Tdap/Td (1 - Tdap) Never done   COLONOSCOPY (Pts 45-9yrs Insurance coverage will need to be confirmed)  Never done   Pneumonia Vaccine 40+ Years old (2 of 2 - PPSV23 or PCV20) 06/09/2018   INFLUENZA VACCINE  10/25/2022   MAMMOGRAM  02/24/2023   Medicare Annual Wellness (AWV)  08/07/2023   DEXA SCAN  Completed   Hepatitis C Screening  Completed   Zoster Vaccines- Shingrix  Completed   HPV VACCINES  [employer]  Maintenance  Health Maintenance Due  Topic Date Due   COVID-19 Vaccine (1) Never done   DTaP/Tdap/Td (1 - Tdap) Never done   COLONOSCOPY (Pts 45-67yrs Insurance coverage will need to be confirmed)  Never done   Pneumonia Vaccine 26+ Years old (2 of 2 - PPSV23 or PCV20) 06/09/2018    Colorectal cancer screening: Referral to GI  placed per patient she had colonoscopy 03/2018 will bring report to next office visit . Pt aware the office will call re: appt.  Mammogram status: Completed 02/2022 Dionne Ano . Repeat every year  Bone Density status: Completed 03/09/2021. Results reflect: Bone density results: OSTEOPENIA. Repeat every 5 years.  Lung Cancer Screening: (Low Dose CT Chest recommended if Age 3-80 years, 30 pack-year currently smoking OR have quit w/in 15years.) does not qualify.   Lung Cancer Screening Referral: n/a  Additional Screening:  Hepatitis C Screening: does not qualify; Completed 07/22/2020  Vision Screening: Recommended annual ophthalmology exams for early detection of glaucoma and other disorders of the eye. Is the patient up to date with their annual eye exam?  Yes  Who is the provider or what is the name of the office in which the patient attends annual eye exams? Dr.Johnson  If pt is not established with a provider, would they like to be referred to a provider to establish care? No .   Dental Screening: Recommended annual dental exams for proper oral hygiene  Community Resource Referral / Chronic Care Management: CRR required this visit?  No   CCM required this visit?  No      Plan:     I have personally reviewed and noted the following in the patient's chart:   Medical and social history Use of alcohol, tobacco or illicit drugs  Current medications and supplements including opioid prescriptions. Patient is not currently taking opioid prescriptions. Functional ability and status Nutritional status Physical activity Advanced directives List of other physicians Hospitalizations, surgeries, and ER visits in previous 12 months Vitals Screenings to include cognitive, depression, and falls Referrals and appointments  In addition, I have reviewed and discussed with patient certain preventive protocols, quality metrics, and best practice recommendations. A written  personalized care plan for preventive services as well as general preventive health recommendations were provided to patient.     Lorrene Reid, LPN   1/61/0960   Nurse Notes: Patient will bring Colonoscopy report and Mammogram report to next office visit  Due TDAP / 2nd pneumonia

## 2022-08-07 NOTE — Patient Instructions (Signed)
Rebecca Gibson , Thank you for taking time to come for your Medicare Wellness Visit. I appreciate your ongoing commitment to your health goals. Please review the following plan we discussed and let me know if I can assist you in the future.   These are the goals we discussed:  Goals      Exercise 3x per week (30 min per time)     Weight (lb) < 175 lb (79.4 kg)        This is a list of the screening recommended for you and due dates:  Health Maintenance  Topic Date Due   COVID-19 Vaccine (1) Never done   DTaP/Tdap/Td vaccine (1 - Tdap) Never done   Colon Cancer Screening  Never done   Pneumonia Vaccine (2 of 2 - PPSV23 or PCV20) 06/09/2018   Flu Shot  10/25/2022   Mammogram  02/24/2023   Medicare Annual Wellness Visit  08/07/2023   DEXA scan (bone density measurement)  Completed   Hepatitis C Screening: USPSTF Recommendation to screen - Ages 18-79 yo.  Completed   Zoster (Shingles) Vaccine  Completed   HPV Vaccine  Aged Out    Advanced directives: Advance directive discussed with you today. I have provided a copy for you to complete at home and have notarized. Once this is complete please bring a copy in to our office so we can scan it into your chart.   Conditions/risks identified: Aim for 30 minutes of exercise or brisk walking, 6-8 glasses of water, and 5 servings of fruits and vegetables each day.   Next appointment: Follow up in one year for your annual wellness visit    Preventive Care 65 Years and Older, Female Preventive care refers to lifestyle choices and visits with your health care provider that can promote health and wellness. What does preventive care include? A yearly physical exam. This is also called an annual well check. Dental exams once or twice a year. Routine eye exams. Ask your health care provider how often you should have your eyes checked. Personal lifestyle choices, including: Daily care of your teeth and gums. Regular physical activity. Eating a  healthy diet. Avoiding tobacco and drug use. Limiting alcohol use. Practicing safe sex. Taking low-dose aspirin every day. Taking vitamin and mineral supplements as recommended by your health care provider. What happens during an annual well check? The services and screenings done by your health care provider during your annual well check will depend on your age, overall health, lifestyle risk factors, and family history of disease. Counseling  Your health care provider may ask you questions about your: Alcohol use. Tobacco use. Drug use. Emotional well-being. Home and relationship well-being. Sexual activity. Eating habits. History of falls. Memory and ability to understand (cognition). Work and work Astronomer. Reproductive health. Screening  You may have the following tests or measurements: Height, weight, and BMI. Blood pressure. Lipid and cholesterol levels. These may be checked every 5 years, or more frequently if you are over 2 years old. Skin check. Lung cancer screening. You may have this screening every year starting at age 48 if you have a 30-pack-year history of smoking and currently smoke or have quit within the past 15 years. Fecal occult blood test (FOBT) of the stool. You may have this test every year starting at age 36. Flexible sigmoidoscopy or colonoscopy. You may have a sigmoidoscopy every 5 years or a colonoscopy every 10 years starting at age 71. Hepatitis C blood test. Hepatitis B blood test. Sexually  transmitted disease (STD) testing. Diabetes screening. This is done by checking your blood sugar (glucose) after you have not eaten for a while (fasting). You may have this done every 1-3 years. Bone density scan. This is done to screen for osteoporosis. You may have this done starting at age 75. Mammogram. This may be done every 1-2 years. Talk to your health care provider about how often you should have regular mammograms. Talk with your health care  provider about your test results, treatment options, and if necessary, the need for more tests. Vaccines  Your health care provider may recommend certain vaccines, such as: Influenza vaccine. This is recommended every year. Tetanus, diphtheria, and acellular pertussis (Tdap, Td) vaccine. You may need a Td booster every 10 years. Zoster vaccine. You may need this after age 4. Pneumococcal 13-valent conjugate (PCV13) vaccine. One dose is recommended after age 23. Pneumococcal polysaccharide (PPSV23) vaccine. One dose is recommended after age 34. Talk to your health care provider about which screenings and vaccines you need and how often you need them. This information is not intended to replace advice given to you by your health care provider. Make sure you discuss any questions you have with your health care provider. Document Released: 04/08/2015 Document Revised: 11/30/2015 Document Reviewed: 01/11/2015 Elsevier Interactive Patient Education  2017 [place] Prevention in the Home Falls can cause injuries. They can happen to people of all ages. There are many things you can do to make your home safe and to help prevent falls. What can I do on the outside of my home? Regularly fix the edges of walkways and driveways and fix any cracks. Remove anything that might make you trip as you walk through a door, such as a raised step or threshold. Trim any bushes or trees on the path to your home. Use bright outdoor lighting. Clear any walking paths of anything that might make someone trip, such as rocks or tools. Regularly check to see if handrails are loose or broken. Make sure that both sides of any steps have handrails. Any raised decks and porches should have guardrails on the edges. Have any leaves, snow, or ice cleared regularly. Use sand or salt on walking paths during winter. Clean up any spills in your garage right away. This includes oil or grease spills. What can I do in the  bathroom? Use night lights. Install grab bars by the toilet and in the tub and shower. Do not use towel bars as grab bars. Use non-skid mats or decals in the tub or shower. If you need to sit down in the shower, use a plastic, non-slip stool. Keep the floor dry. Clean up any water that spills on the floor as soon as it happens. Remove soap buildup in the tub or shower regularly. Attach bath mats securely with double-sided non-slip rug tape. Do not have throw rugs and other things on the floor that can make you trip. What can I do in the bedroom? Use night lights. Make sure that you have a light by your bed that is easy to reach. Do not use any sheets or blankets that are too big for your bed. They should not hang down onto the floor. Have a firm chair that has side arms. You can use this for support while you get dressed. Do not have throw rugs and other things on the floor that can make you trip. What can I do in the kitchen? Clean up any spills right away. Avoid  walking on wet floors. Keep items that you use a lot in easy-to-reach places. If you need to reach something above you, use a strong step stool that has a grab bar. Keep electrical cords out of the way. Do not use floor polish or wax that makes floors slippery. If you must use wax, use non-skid floor wax. Do not have throw rugs and other things on the floor that can make you trip. What can I do with my stairs? Do not leave any items on the stairs. Make sure that there are handrails on both sides of the stairs and use them. Fix handrails that are broken or loose. Make sure that handrails are as long as the stairways. Check any carpeting to make sure that it is firmly attached to the stairs. Fix any carpet that is loose or worn. Avoid having throw rugs at the top or bottom of the stairs. If you do have throw rugs, attach them to the floor with carpet tape. Make sure that you have a light switch at the top of the stairs and the  bottom of the stairs. If you do not have them, ask someone to add them for you. What else can I do to help prevent falls? Wear shoes that: Do not have high heels. Have rubber bottoms. Are comfortable and fit you well. Are closed at the toe. Do not wear sandals. If you use a stepladder: Make sure that it is fully opened. Do not climb a closed stepladder. Make sure that both sides of the stepladder are locked into place. Ask someone to hold it for you, if possible. Clearly mark and make sure that you can see: Any grab bars or handrails. First and last steps. Where the edge of each step is. Use tools that help you move around (mobility aids) if they are needed. These include: Canes. Walkers. Scooters. Crutches. Turn on the lights when you go into a dark area. Replace any light bulbs as soon as they burn out. Set up your furniture so you have a clear path. Avoid moving your furniture around. If any of your floors are uneven, fix them. If there are any pets around you, be aware of where they are. Review your medicines with your doctor. Some medicines can make you feel dizzy. This can increase your chance of falling. Ask your doctor what other things that you can do to help prevent falls. This information is not intended to replace advice given to you by your health care provider. Make sure you discuss any questions you have with your health care provider. Document Released: 01/06/2009 Document Revised: 08/18/2015 Document Reviewed: 04/16/2014 Elsevier Interactive Patient Education  2017 [employer].

## 2022-08-24 ENCOUNTER — Encounter: Payer: Self-pay | Admitting: Family Medicine

## 2022-09-14 ENCOUNTER — Other Ambulatory Visit: Payer: Self-pay | Admitting: Family Medicine

## 2022-11-14 ENCOUNTER — Other Ambulatory Visit: Payer: Self-pay

## 2022-11-14 ENCOUNTER — Other Ambulatory Visit: Payer: Medicare HMO

## 2022-11-14 DIAGNOSIS — E7800 Pure hypercholesterolemia, unspecified: Secondary | ICD-10-CM

## 2022-11-14 DIAGNOSIS — R7303 Prediabetes: Secondary | ICD-10-CM

## 2022-11-14 LAB — LIPID PANEL

## 2022-11-14 LAB — BAYER DCA HB A1C WAIVED: HB A1C (BAYER DCA - WAIVED): 5.8 % — ABNORMAL HIGH (ref 4.8–5.6)

## 2022-11-15 LAB — CMP14+EGFR
ALT: 6 IU/L (ref 0–32)
AST: 13 IU/L (ref 0–40)
Albumin: 4.3 g/dL (ref 3.9–4.9)
Alkaline Phosphatase: 96 IU/L (ref 44–121)
BUN/Creatinine Ratio: 25 (ref 12–28)
BUN: 17 mg/dL (ref 8–27)
Bilirubin Total: 0.6 mg/dL (ref 0.0–1.2)
CO2: 25 mmol/L (ref 20–29)
Calcium: 9.3 mg/dL (ref 8.7–10.3)
Chloride: 103 mmol/L (ref 96–106)
Creatinine, Ser: 0.68 mg/dL (ref 0.57–1.00)
Globulin, Total: 2.5 g/dL (ref 1.5–4.5)
Glucose: 101 mg/dL — ABNORMAL HIGH (ref 70–99)
Potassium: 4.4 mmol/L (ref 3.5–5.2)
Sodium: 142 mmol/L (ref 134–144)
Total Protein: 6.8 g/dL (ref 6.0–8.5)
eGFR: 95 mL/min/{1.73_m2} (ref >59)

## 2022-11-15 LAB — CBC WITH DIFFERENTIAL/PLATELET
Basophils Absolute: 0.1 10*3/uL (ref 0.0–0.2)
Basos: 1 %
EOS (ABSOLUTE): 0.3 10*3/uL (ref 0.0–0.4)
Eos: 4 %
Hematocrit: 45.6 % (ref 34.0–46.6)
Hemoglobin: 15.4 g/dL (ref 11.1–15.9)
Immature Grans (Abs): 0 10*3/uL (ref 0.0–0.1)
Immature Granulocytes: 0 %
Lymphocytes Absolute: 2.9 10*3/uL (ref 0.7–3.1)
Lymphs: 36 %
MCH: 31.2 pg (ref 26.6–33.0)
MCHC: 33.8 g/dL (ref 31.5–35.7)
MCV: 92 fL (ref 79–97)
Monocytes Absolute: 0.6 10*3/uL (ref 0.1–0.9)
Monocytes: 8 %
Neutrophils Absolute: 4.2 10*3/uL (ref 1.4–7.0)
Neutrophils: 51 %
Platelets: 187 10*3/uL (ref 150–450)
RBC: 4.94 x10E6/uL (ref 3.77–5.28)
RDW: 12.3 % (ref 11.7–15.4)
WBC: 8.1 10*3/uL (ref 3.4–10.8)

## 2022-11-15 LAB — LIPID PANEL
Chol/HDL Ratio: 3 ratio (ref 0.0–4.4)
Cholesterol, Total: 118 mg/dL (ref 100–199)
HDL: 40 mg/dL (ref >39)
LDL Chol Calc (NIH): 51 mg/dL (ref 0–99)
Triglycerides: 159 mg/dL — ABNORMAL HIGH (ref 0–149)
VLDL Cholesterol Cal: 27 mg/dL (ref 5–40)

## 2022-11-16 ENCOUNTER — Encounter: Payer: Self-pay | Admitting: Family Medicine

## 2022-11-16 ENCOUNTER — Ambulatory Visit (INDEPENDENT_AMBULATORY_CARE_PROVIDER_SITE_OTHER): Payer: Medicare HMO | Admitting: Family Medicine

## 2022-11-16 VITALS — BP 133/72 | HR 90 | Temp 97.9°F | Ht 65.0 in | Wt 185.0 lb

## 2022-11-16 DIAGNOSIS — R7303 Prediabetes: Secondary | ICD-10-CM

## 2022-11-16 DIAGNOSIS — Z0001 Encounter for general adult medical examination with abnormal findings: Secondary | ICD-10-CM | POA: Diagnosis not present

## 2022-11-16 DIAGNOSIS — Z23 Encounter for immunization: Secondary | ICD-10-CM

## 2022-11-16 DIAGNOSIS — E781 Pure hyperglyceridemia: Secondary | ICD-10-CM

## 2022-11-16 DIAGNOSIS — F17210 Nicotine dependence, cigarettes, uncomplicated: Secondary | ICD-10-CM

## 2022-11-16 DIAGNOSIS — Z0000 Encounter for general adult medical examination without abnormal findings: Secondary | ICD-10-CM

## 2022-11-16 MED ORDER — NICOTINE 14 MG/24HR TD PT24: 14.0000 mg | MEDICATED_PATCH | Freq: Every day | TRANSDERMAL | 0 refills | Status: DC

## 2022-11-16 MED ORDER — NICOTINE 7 MG/24HR TD PT24: 7.0000 mg | MEDICATED_PATCH | Freq: Every day | TRANSDERMAL | 0 refills | Status: DC

## 2022-11-16 MED ORDER — NICOTINE 21 MG/24HR TD PT24: 21.0000 mg | MEDICATED_PATCH | Freq: Every day | TRANSDERMAL | 0 refills | Status: DC

## 2022-11-16 MED ORDER — ROSUVASTATIN CALCIUM 5 MG PO TABS: 5.0000 mg | ORAL_TABLET | Freq: Every day | ORAL | 3 refills | Status: DC

## 2022-11-16 NOTE — Patient Instructions (Addendum)
Nicotine patches: Start with 21mg / d patch month #1. If you do NOT get up in the middle of the night to smoke, REMOVE patch before bedtime. Month #2: 14mg /d Month #3: 7mg /d If close but not off and you want to repeat 7mg  dosing, let me know and I will refill. If not successful with patch and you want to try Chantix or Zyban, let me know.  Lung Cancer Screening A lung cancer screening is a test that checks for lung cancer when there are no symptoms or history of that disease. The screening is done to look for lung cancer in its very early stages. Finding cancer early improves the chances of successful treatment. It may save your life. Who should have a screening? You should be screened for lung cancer if all of these apply: You currently smoke or you used to smoke. You are between the ages of 72 and 11 years old. Screening may be recommended up to age 109 depending on your overall health and other factors. You have a smoking history of 1 pack of cigarettes a day for 20 years or 2 packs a day for 10 years. How is screening done?  The recommended screening test is a low-dose computed tomography (LDCT) scan. This scan takes detailed images of the lungs. This allows a health care provider to look for abnormal cells. If you are at risk for lung cancer, it is recommended that you get screened once a year. Talk to your health care provider about the risks, benefits, and limitations of screening. What are the benefits of screening? Screening can find lung cancer early, before symptoms start and before it has spread outside of the lungs. The chances of curing lung cancer are greater if the cancer is diagnosed early. What are the risks of screening? The screening may show lung cancer when no cancer is present. Talk with your health care provider about what your results mean. In some cases, your health care provider may do more testing to confirm the results. The screening may not find lung cancer when it is  present. You will be exposed to radiation from repeated LDCT tests, which can cause cancer in otherwise healthy people. How can I lower my risk of lung cancer? Make these lifestyle changes to lower your risk of developing lung cancer: Do not use any products that contain nicotine or tobacco. These products include cigarettes, chewing tobacco, and vaping devices, such as e-cigarettes. If you need help quitting, ask your health care provider. Avoid secondhand smoke. Avoid exposure to radiation. Avoid exposure to radon gas. Have your home checked for radon regularly. Avoid things that cause cancer (carcinogens). Avoid living or working in places with high air pollution or diesel exhaust. Questions to ask your health care provider Am I eligible for lung cancer screening? Does my health insurance cover the cost of lung cancer screening? What happens if the lung cancer screening shows something of concern? How soon will I have results from my lung cancer screening? Is there anything that I need to do to prepare for my lung cancer screening? What happens if I decide not to have lung cancer screening? Where to find more information Ask your health care provider about the risks and benefits of screening. More information and resources are available from these organizations: American Cancer Society (ACS): cancer.org American Lung Association: lung.org National Cancer Institute: cancer.gov Contact a health care provider if: You start to show symptoms of lung cancer, including: A cough that will not go away.  High-pitched whistling sounds when you breathe, most often when you breathe out (wheezing). Chest pain. Coughing up blood. Shortness of breath. Weight loss that cannot be explained. Constant tiredness (fatigue). Hoarse voice. Summary Lung cancer screening may find lung cancer before symptoms appear. Finding cancer early improves the chances of successful treatment. It may save your  life. The recommended screening test is a low-dose computed tomography (LDCT) scan that looks for abnormal cells in the lungs. If you are at risk for lung cancer, it is recommended that you get screened once a year. You can make lifestyle changes to lower your risk of lung cancer. Ask your health care provider about the risks and benefits of screening. This information is not intended to replace advice given to you by your health care provider. Make sure you discuss any questions you have with your health care provider. Document Revised: 03/20/2022 Document Reviewed: 08/31/2020 Elsevier Patient Education  2024 [employer].

## 2022-11-16 NOTE — Progress Notes (Unsigned)
Rebecca Gibson is a 67 y.o. female presents to office today for annual physical exam examination.    Concerns today include: 1. None. She is doing well  Occupation: retired, Substance use: current smoker.  She smokes roughly half pack per day but up to 1 pack/day and has done so for over 30 years.  She quit between 12/22/2010 and 12-21-13 but resumed smoking in 2013-12-21 her dad passed away.  She reports no hemoptysis, unplanned weight loss, night sweats, shortness of breath, change in exercise tolerance or difficulty swallowing.  She is interested in lung cancer screening Health Maintenance Due  Topic Date Due   Lung Cancer Screening  Never done   Refills needed today: crestor  Immunization History  Administered Date(s) Administered   PNEUMOCOCCAL CONJUGATE-20 11/16/2022   Pneumococcal Conjugate-13 04/14/2018   Tdap 11/16/2022   Zoster Recombinant(Shingrix) 04/14/2018, 10/02/2018   Past Medical History:  Diagnosis Date   Hot flashes, menopausal    treatment with lexapro   SVD (spontaneous vaginal delivery)    x 2   Social History   Socioeconomic History   Marital status: Divorced    Spouse name: Not on file   Number of children: 2   Years of education: Not on file   Highest education level: Not on file  Occupational History    Employer: [employer]   Tobacco Use   Smoking status: Every Day    Current packs/day: 1.00    Average packs/day: 0.6 packs/day for 44.6 years (27.1 ttl pk-yrs)    Types: Cigarettes    Start date: 03/16/1977    Last attempt to quit: 03/16/2012   Smokeless tobacco: Never  Substance and Sexual Activity   Alcohol use: Yes    Comment: socially   Drug use: No   Sexual activity: Yes    Birth control/protection: Post-menopausal  Other Topics Concern   Not on file  Social History Narrative   Lives at home with youngest son.      Social Determinants of Health   Financial Resource Strain: Low Risk  (08/07/2022)   Overall Financial Resource Strain (CARDIA)     Difficulty of Paying Living Expenses: Not hard at all  Food Insecurity: No Food Insecurity (08/07/2022)   Hunger Vital Sign    Worried About Running Out of Food in the Last Year: Never true    Ran Out of Food in the Last Year: Never true  Transportation Needs: No Transportation Needs (08/07/2022)   PRAPARE - Administrator, Civil Service (Medical): No    Lack of Transportation (Non-Medical): No  Physical Activity: Insufficiently Active (08/07/2022)   Exercise Vital Sign    Days of Exercise per Week: 3 days    Minutes of Exercise per Session: 30 min  Stress: No Stress Concern Present (08/07/2022)   [employer] of Occupational Health - Occupational Stress Questionnaire    Feeling of Stress : Not at all  Social Connections: Moderately Isolated (08/07/2022)   Social Connection and Isolation Panel [NHANES]    Frequency of Communication with Friends and Family: More than three times a week    Frequency of Social Gatherings with Friends and Family: More than three times a week    Attends Religious Services: Never    Database administrator or Organizations: Yes    Attends Engineer, structural: More than 4 times per year    Marital Status: Divorced  Intimate Partner Violence: Not At Risk (08/07/2022)   Humiliation, Afraid, Rape, and Kick  questionnaire    Fear of Current or Ex-Partner: No    Emotionally Abused: No    Physically Abused: No    Sexually Abused: No   Past Surgical History:  Procedure Laterality Date   APPENDECTOMY     CHOLECYSTECTOMY     HYSTEROSCOPY WITH D & C  03/07/2012   Procedure: DILATATION AND CURETTAGE /HYSTEROSCOPY;  Surgeon: Philip Aspen, DO;  Location: [facility];  Service: Gynecology;  Laterality: N/A;   KNEE SURGERY     right   Family History  Problem Relation Age of Onset   Hypertension Father     Current Outpatient Medications:    nicotine (NICODERM CQ - DOSED IN MG/24 HOURS) 14 mg/24hr patch, Place 1 patch (14 mg total) onto the  skin daily., Disp: 28 patch, Rfl: 0   nicotine (NICODERM CQ - DOSED IN MG/24 HOURS) 21 mg/24hr patch, Place 1 patch (21 mg total) onto the skin daily., Disp: 28 patch, Rfl: 0   nicotine (NICODERM CQ - DOSED IN MG/24 HR) 7 mg/24hr patch, Place 1 patch (7 mg total) onto the skin daily., Disp: 28 patch, Rfl: 0   rosuvastatin (CRESTOR) 5 MG tablet, Take 1 tablet (5 mg total) by mouth daily., Disp: 100 tablet, Rfl: 3  Allergies  Allergen Reactions   Bee Venom Swelling   Codeine Nausea Only   Ivp Dye [Iodinated Contrast Media] Hives and Swelling     ROS: Review of Systems Pertinent items noted in HPI and remainder of comprehensive ROS otherwise negative.    Physical exam BP 133/72   Pulse 90   Temp 97.9 F (36.6 C)   Ht 5\' 5"  (1.651 m)   Wt 185 lb (83.9 kg)   SpO2 95%   BMI 30.79 kg/m  General appearance: alert, cooperative, appears stated age, and no distress Head: Normocephalic, without obvious abnormality, atraumatic Eyes: negative findings: lids and lashes normal, conjunctivae and sclerae normal, corneas clear, and pupils equal, round, reactive to light and accomodation Ears: normal TM's and external ear canals both ears Nose: Nares normal. Septum midline. Mucosa normal. No drainage or sinus tenderness. Throat: lips, mucosa, and tongue normal; teeth and gums normal Neck: no adenopathy, no carotid bruit, supple, symmetrical, trachea midline, and thyroid not enlarged, symmetric, no tenderness/mass/nodules Back: symmetric, no curvature. ROM normal. No CVA tenderness. Lungs: clear to auscultation bilaterally Heart: regular rate and rhythm, S1, S2 normal, no murmur, click, rub or gallop Abdomen: soft, non-tender; bowel sounds normal; no masses,  no organomegaly Extremities: extremities normal, atraumatic, no cyanosis or edema Pulses: 2+ and symmetric Skin: Skin color, texture, turgor normal. No rashes or lesions Lymph nodes: Cervical, supraclavicular, and axillary nodes  normal. Neurologic: Grossly normal      11/16/2022   11:03 AM 08/07/2022   11:20 AM 08/02/2021    9:27 AM  Depression screen PHQ 2/9  Decreased Interest 0 0 0  Down, Depressed, Hopeless 0 0 0  PHQ - 2 Score 0 0 0  Altered sleeping 0    Tired, decreased energy 0    Change in appetite 0    Feeling bad or failure about yourself  0    Trouble concentrating 0    Moving slowly or fidgety/restless 0    Suicidal thoughts 0    PHQ-9 Score 0    Difficult doing work/chores Not difficult at all        11/16/2022   11:03 AM 08/02/2021    9:27 AM 04/14/2021   12:50 PM  GAD 7 :  Generalized Anxiety Score  Nervous, Anxious, on Edge 0 0 0  Control/stop worrying 0 0 0  Worry too much - different things 0 0 0  Trouble relaxing 0 0 0  Restless 0 0 0  Easily annoyed or irritable 0 0 0  Afraid - awful might happen 0 0 0  Total GAD 7 Score 0 0 0  Anxiety Difficulty Not difficult at all Not difficult at all     Assessment/ Plan: Dennie Maizes here for annual physical exam.   Annual physical exam - Plan: Tdap vaccine greater than or equal to 7yo IM, Pneumococcal conjugate vaccine 20-valent (Prevnar 20)  Pre-diabetes  Hypertriglyceridemia - Plan: rosuvastatin (CRESTOR) 5 MG tablet  Heavy tobacco smoker >10 cigarettes per day - Plan: Ambulatory Referral Lung Cancer Screening [facility], nicotine (NICODERM CQ - DOSED IN MG/24 HOURS) 21 mg/24hr patch, nicotine (NICODERM CQ - DOSED IN MG/24 HOURS) 14 mg/24hr patch, nicotine (NICODERM CQ - DOSED IN MG/24 HR) 7 mg/24hr patch  Pneumococcal vaccination and tetanus vaccination administered.  Labs were reviewed with the patient.  Reinforced carb modification to reduce triglycerides and blood sugar.  She is interested in smoking cessation though reports extreme pleasure and tobacco use recognizes the risks that are associated with ongoing cigarette smoking.  I have sent in patches for her and encouraged her to contact me should these be ineffective in  helping her reach goals.  We can consider Chantix or Zyban.  Referral to lung cancer screening also placed.  Currently asymptomatic.  Counseled on healthy lifestyle choices, including diet (rich in fruits, vegetables and lean meats and low in salt and simple carbohydrates) and exercise (at least 30 minutes of moderate physical activity daily).  Patient to follow up 1 year for annual physical, sooner if concerns arise  Sparkle Aube M. Nadine Counts, DO

## 2022-11-17 ENCOUNTER — Encounter: Payer: Self-pay | Admitting: Family Medicine

## 2023-01-25 ENCOUNTER — Other Ambulatory Visit: Payer: Self-pay

## 2023-01-25 DIAGNOSIS — F17210 Nicotine dependence, cigarettes, uncomplicated: Secondary | ICD-10-CM

## 2023-01-25 DIAGNOSIS — Z87891 Personal history of nicotine dependence: Secondary | ICD-10-CM

## 2023-01-25 DIAGNOSIS — Z122 Encounter for screening for malignant neoplasm of respiratory organs: Secondary | ICD-10-CM

## 2023-02-01 ENCOUNTER — Ambulatory Visit (INDEPENDENT_AMBULATORY_CARE_PROVIDER_SITE_OTHER): Payer: Medicare HMO | Admitting: Adult Health

## 2023-02-01 ENCOUNTER — Encounter: Payer: Self-pay | Admitting: Adult Health

## 2023-02-01 DIAGNOSIS — F17210 Nicotine dependence, cigarettes, uncomplicated: Secondary | ICD-10-CM | POA: Diagnosis not present

## 2023-02-01 NOTE — Patient Instructions (Signed)
 Thank you for participating in the  Lung Cancer Screening Program. It was our pleasure to meet you today. We will call you with the results of your scan within the next few days. Your scan will be assigned a Lung RADS category score by the physicians reading the scans.  This Lung RADS score determines follow up scanning.  See below for description of categories, and follow up screening recommendations. We will be in touch to schedule your follow up screening annually or based on recommendations of our providers. We will fax a copy of your scan results to your Primary Care Physician, or the physician who referred you to the program, to ensure they have the results. Please call the office if you have any questions or concerns regarding your scanning experience or results.  Our office number is [phone]. Please speak with Abigail Miyamoto, RN., Karlton Lemon RN, or Pietro Cassis RN. They are  our Lung Cancer Screening RN.'s If They are unavailable when you call, Please leave a message on the voice mail. We will return your call at our earliest convenience.This voice mail is monitored several times a day.  Remember, if your scan is normal, we will scan you annually as long as you continue to meet the criteria for the program. (Age 67-80, Current smoker or smoker who has quit within the last 15 years). If you are a smoker, remember, quitting is the single most powerful action that you can take to decrease your risk of lung cancer and other pulmonary, breathing related problems. We know quitting is hard, and we are here to help.  Please let us know if there is anything we can do to help you meet your goal of quitting. If you are a former smoker, Counselling psychologist. We are proud of you! Remain smoke free! Remember you can refer friends or family members through the number above.  We will screen them to make sure they meet criteria for the program. Thank you for helping [place] take better care of you  by participating in Lung Screening.   Lung RADS Categories:  Lung RADS 1: no nodules or definitely non-concerning nodules.  Recommendation is for a repeat annual scan in 12 months.  Lung RADS 2:  nodules that are non-concerning in appearance and behavior with a very low likelihood of becoming an active cancer. Recommendation is for a repeat annual scan in 12 months.  Lung RADS 3: nodules that are probably non-concerning , includes nodules with a low likelihood of becoming an active cancer.  Recommendation is for a 40-month repeat screening scan. Often noted after an upper respiratory illness. We will be in touch to make sure you have no questions, and to schedule your 22-month scan.  Lung RADS 4 A: nodules with concerning findings, recommendation is most often for a follow up scan in 3 months or additional testing based on our provider's assessment of the scan. We will be in touch to make sure you have no questions and to schedule the recommended 3 month follow up scan.  Lung RADS 4 B:  indicates findings that are concerning. We will be in touch with you to schedule additional diagnostic testing based on our provider's  assessment of the scan.  You can receive free nicotine replacement therapy ( patches, gum or mints) by calling 1-800-QUIT NOW. Please call so we can get you on the path to becoming  a non-smoker. I know it is hard, but you can do this!  Other options for assistance in  smoking cessation ( As covered by your insurance benefits)  Hypnosis for smoking cessation  [employer] [phone]  Acupuncture for smoking cessation  [employer] [phone]

## 2023-02-01 NOTE — Progress Notes (Signed)
  Virtual Visit via Telephone Note  I connected with Rebecca Gibson , 02/01/23 10:00 AM by a telemedicine application and verified that I am speaking with the correct person using two identifiers.  Location: Patient: home Provider: home   I discussed the limitations of evaluation and management by telemedicine and the availability of in person appointments. The patient expressed understanding and agreed to proceed.   Shared Decision Making Visit Lung Cancer Screening Program [phone])   Eligibility: 67 y.o. Pack Years Smoking History Calculation = 50 pack years  (# packs/per year x # years smoked) Recent History of coughing up blood  no Unexplained weight loss? no ( >Than 15 pounds within the last 6 months ) Prior History Lung / other cancer no (Diagnosis within the last 5 years already requiring surveillance chest CT Scans). Smoking Status Current Smoker  Visit Components: Discussion included one or more decision making aids. YES Discussion included risk/benefits of screening. YES Discussion included potential follow up diagnostic testing for abnormal scans. YES Discussion included meaning and risk of over diagnosis. YES Discussion included meaning and risk of False Positives. YES Discussion included meaning of total radiation exposure. YES  Counseling Included: Importance of adherence to annual lung cancer LDCT screening. YES Impact of comorbidities on ability to participate in the program. YES Ability and willingness to under diagnostic treatment. YES  Smoking Cessation Counseling: Current Smokers:  Discussed importance of smoking cessation. yes Information about tobacco cessation classes and interventions provided to patient. yes Patient provided with "ticket" for LDCT Scan. yes Symptomatic Patient. no Diagnosis Code: Tobacco Use Z72.0 Asymptomatic Patient yes  Counseling (Intermediate counseling: > three minutes counseling) [id number] Information about tobacco cessation  classes and interventions provided to patient. Yes Patient provided with "ticket" for LDCT Scan. yes Written Order for Lung Cancer Screening with LDCT placed in Epic. Yes (CT Chest Lung Cancer Screening Low Dose W/O CM) [id number]  Z12.2-Screening of respiratory organs Z87.891-Personal history of nicotine dependence   Danford Bad 02/01/23

## 2023-02-08 ENCOUNTER — Ambulatory Visit (HOSPITAL_BASED_OUTPATIENT_CLINIC_OR_DEPARTMENT_OTHER)
Admission: RE | Admit: 2023-02-08 | Discharge: 2023-02-08 | Disposition: A | Discharge status: Home / Self Care | Payer: Medicare HMO | Source: Ambulatory Visit | Attending: Family Medicine | Admitting: Family Medicine

## 2023-02-08 DIAGNOSIS — Z122 Encounter for screening for malignant neoplasm of respiratory organs: Secondary | ICD-10-CM | POA: Insufficient documentation

## 2023-02-08 DIAGNOSIS — F17210 Nicotine dependence, cigarettes, uncomplicated: Secondary | ICD-10-CM | POA: Diagnosis not present

## 2023-02-08 DIAGNOSIS — Z87891 Personal history of nicotine dependence: Secondary | ICD-10-CM

## 2023-02-20 DIAGNOSIS — R0982 Postnasal drip: Secondary | ICD-10-CM | POA: Diagnosis not present

## 2023-02-20 DIAGNOSIS — B349 Viral infection, unspecified: Secondary | ICD-10-CM | POA: Diagnosis not present

## 2023-02-20 DIAGNOSIS — R0981 Nasal congestion: Secondary | ICD-10-CM | POA: Diagnosis not present

## 2023-02-20 DIAGNOSIS — Z20822 Contact with and (suspected) exposure to covid-19: Secondary | ICD-10-CM | POA: Diagnosis not present

## 2023-03-05 ENCOUNTER — Other Ambulatory Visit: Payer: Self-pay | Admitting: Acute Care

## 2023-03-05 DIAGNOSIS — F17210 Nicotine dependence, cigarettes, uncomplicated: Secondary | ICD-10-CM

## 2023-03-05 DIAGNOSIS — Z122 Encounter for screening for malignant neoplasm of respiratory organs: Secondary | ICD-10-CM

## 2023-03-05 DIAGNOSIS — Z87891 Personal history of nicotine dependence: Secondary | ICD-10-CM

## 2023-03-06 ENCOUNTER — Encounter: Payer: Self-pay | Admitting: Family Medicine

## 2023-03-06 ENCOUNTER — Other Ambulatory Visit: Payer: Self-pay | Admitting: Family Medicine

## 2023-03-06 DIAGNOSIS — I2510 Atherosclerotic heart disease of native coronary artery without angina pectoris: Secondary | ICD-10-CM

## 2023-03-06 NOTE — Progress Notes (Signed)
Orders Placed This Encounter  Procedures   CT CARDIAC SCORING (SELF PAY ONLY)    Standing Status:   Future    Standing Expiration Date:   03/05/2024    Order Specific Question:   Preferred imaging location?    Answer:   [facility]

## 2023-03-11 ENCOUNTER — Telehealth: Payer: Self-pay | Admitting: Family Medicine

## 2023-03-12 DIAGNOSIS — Z01419 Encounter for gynecological examination (general) (routine) without abnormal findings: Secondary | ICD-10-CM | POA: Diagnosis not present

## 2023-03-12 DIAGNOSIS — Z124 Encounter for screening for malignant neoplasm of cervix: Secondary | ICD-10-CM | POA: Diagnosis not present

## 2023-03-12 DIAGNOSIS — Z1231 Encounter for screening mammogram for malignant neoplasm of breast: Secondary | ICD-10-CM | POA: Diagnosis not present

## 2023-03-12 NOTE — Telephone Encounter (Signed)
R/C to Patient - Patient is aware of CT Appt information.

## 2023-03-12 NOTE — Telephone Encounter (Signed)
Per note, patient is aware of CT appointment info

## 2023-05-01 ENCOUNTER — Ambulatory Visit (HOSPITAL_BASED_OUTPATIENT_CLINIC_OR_DEPARTMENT_OTHER)
Admission: RE | Admit: 2023-05-01 | Discharge: 2023-05-01 | Disposition: A | Discharge status: Home / Self Care | Payer: Self-pay | Source: Ambulatory Visit | Attending: Family Medicine | Admitting: Family Medicine

## 2023-05-01 DIAGNOSIS — I2510 Atherosclerotic heart disease of native coronary artery without angina pectoris: Secondary | ICD-10-CM | POA: Insufficient documentation

## 2023-05-06 ENCOUNTER — Encounter: Payer: Self-pay | Admitting: Family Medicine

## 2023-05-07 ENCOUNTER — Telehealth: Payer: Self-pay | Admitting: Family Medicine

## 2023-05-07 NOTE — Telephone Encounter (Signed)
Attempted to call pt again , goes straight to vm - left vm for cb

## 2023-05-07 NOTE — Telephone Encounter (Signed)
Copied from CRM [id number]. Topic: Clinical - Lab/Test Results >> May 07, 2023 12:34 PM Carlatta H wrote: Reason for CRM: Patient received a call regarding CT results//Please return call

## 2023-05-08 ENCOUNTER — Other Ambulatory Visit: Payer: Self-pay | Admitting: Family Medicine

## 2023-05-08 DIAGNOSIS — I2510 Atherosclerotic heart disease of native coronary artery without angina pectoris: Secondary | ICD-10-CM

## 2023-05-08 DIAGNOSIS — E781 Pure hyperglyceridemia: Secondary | ICD-10-CM

## 2023-05-08 DIAGNOSIS — R931 Abnormal findings on diagnostic imaging of heart and coronary circulation: Secondary | ICD-10-CM

## 2023-05-08 NOTE — Telephone Encounter (Signed)
Pt says she deff does not want to go to [place]. Says she guesses she prefers [place] or if she can get in soon with Dr Antoine Poche here in [place], that would be great too.  Pt says regarding her cholesterol Rx, initially she was told to take 1x daily but then her levels were too low doing that so Dr Nadine Counts told her to only take 2x per week which is what she has been doing. Wants to know how often Dr Nadine Counts wants her to take the Rx now?

## 2023-05-08 NOTE — Telephone Encounter (Signed)
I think we had spaced her med out to a few times per week because maybe she was having some bodyaches.  However I have reviewed her cholesterol levels and I do not see anything where she was ever "too low".  I am not confident that she can get in with Dr. Antoine Poche that quickly but I will certainly ask as he has offices both in [place] and in [place].

## 2023-05-08 NOTE — Telephone Encounter (Signed)
Addressed in another encounter. LS

## 2023-05-30 ENCOUNTER — Encounter: Payer: Self-pay | Admitting: Family Medicine

## 2023-07-15 ENCOUNTER — Encounter: Payer: Self-pay | Admitting: Cardiology

## 2023-07-15 NOTE — Progress Notes (Signed)
  Cardiology Office Note:   Date:  07/15/2023  ID:  Rebecca Gibson, DOB 12-Feb-1956, MRN [id number] PCP: Eliodoro Guerin, DO  [facility] Providers Cardiologist:  None {  History of Present Illness:   Rebecca Gibson is a 68 y.o. female ***evaluation of ***.  I saw her in 2015 for evaluation of palpitations.  She had a previous history of some atrial tachycardia.  No change in therapy was necessary.  She is referred back because of an elevated coronary calcium  score.  She had a coronary calcium  score of 86.8 which was 76 percentile.  She has some aortic atherosclerosis.  The predominance of the calcium  was in the LAD.  ROS: ***  Studies Reviewed:    EKG:       ***  Risk Assessment/Calculations:   {Does this patient have ATRIAL FIBRILLATION?:[phone]} No BP recorded.  {Refresh Note OR Click here to enter BP  :1}***        Physical Exam:   VS:  There were no vitals taken for this visit.   Wt Readings from Last 3 Encounters:  11/16/22 185 lb (83.9 kg)  08/07/22 188 lb (85.3 kg)  08/02/21 183 lb 9.6 oz (83.3 kg)     GEN: Well nourished, well developed in no acute distress NECK: No JVD; No carotid bruits CARDIAC: ***RR, *** murmurs, rubs, gallops RESPIRATORY:  Clear to auscultation without rales, wheezing or rhonchi  ABDOMEN: Soft, non-tender, non-distended EXTREMITIES:  No edema; No deformity   ASSESSMENT AND PLAN:   ***    Elevated coronary calcium  score:  ***   Aortic atherosclerosis:  ***  Pulmonary nodule: 2 mm solid pulmonary nodule in left lower lobe unchanged from 02/08/2023.  I will repeat a CT in 02/13/2024.   Follow up ***  Signed, Eilleen Grates, MD

## 2023-07-16 ENCOUNTER — Ambulatory Visit: Payer: Medicare HMO | Attending: Cardiology | Admitting: Cardiology

## 2023-07-16 ENCOUNTER — Encounter: Payer: Self-pay | Admitting: Cardiology

## 2023-07-16 VITALS — BP 122/70 | HR 96 | Ht 64.5 in | Wt 194.0 lb

## 2023-07-16 DIAGNOSIS — R0602 Shortness of breath: Secondary | ICD-10-CM | POA: Insufficient documentation

## 2023-07-16 DIAGNOSIS — R002 Palpitations: Secondary | ICD-10-CM

## 2023-07-16 NOTE — Patient Instructions (Addendum)
 Medication Instructions:  Your physician recommends that you continue on your current medications as directed. Please refer to the Current Medication list given to you today.  *If you need a refill on your cardiac medications before your next appointment, please call your pharmacy*  Testing/Procedures: Your physician has requested that you have an exercise tolerance test.. An exercise tolerance test is a test to check how your heart works during exercise. You will need to walk on a treadmill or for this test. An electrocardiogram (ECG) will record your heartbeat when you are at rest and when you are exercising.  Please also follow instruction sheet, as given.   Do not drink or eat foods with caffeine for 24 hours before the test. (Chocolate, coffee, tea, or energy drinks) If you use an inhaler, bring it with you to the test. Do not smoke for 4 hours before the test. Wear comfortable shoes and clothing.    Follow-Up: At [facility], you and your health needs are our priority.  As part of our continuing mission to provide you with exceptional heart care, our providers are all part of one team.  This team includes your primary Cardiologist (physician) and Advanced Practice Providers or APPs (Physician Assistants and Nurse Practitioners) who all work together to provide you with the care you need, when you need it.  Your next appointment:   As needed  Provider:   Dr. Lavonne Prairie   Other Instructions    1st Floor: - Lobby - Registration  - Pharmacy  - Lab - Cafe  2nd Floor: - PV Lab - Diagnostic Testing (echo, CT, nuclear med)  3rd Floor: - Vacant  4th Floor: - TCTS (cardiothoracic surgery) - AFib Clinic - Structural Heart Clinic - Vascular Surgery  - Vascular Ultrasound  5th Floor: - HeartCare Cardiology (general and EP) - Clinical Pharmacy for coumadin, hypertension, lipid, weight-loss medications, and med management appointments    Valet parking services will  be available as well.

## 2023-07-18 DIAGNOSIS — H02834 Dermatochalasis of left upper eyelid: Secondary | ICD-10-CM | POA: Diagnosis not present

## 2023-07-18 DIAGNOSIS — H5203 Hypermetropia, bilateral: Secondary | ICD-10-CM | POA: Diagnosis not present

## 2023-07-18 DIAGNOSIS — H35363 Drusen (degenerative) of macula, bilateral: Secondary | ICD-10-CM | POA: Diagnosis not present

## 2023-07-18 DIAGNOSIS — H02831 Dermatochalasis of right upper eyelid: Secondary | ICD-10-CM | POA: Diagnosis not present

## 2023-07-18 DIAGNOSIS — H2513 Age-related nuclear cataract, bilateral: Secondary | ICD-10-CM | POA: Diagnosis not present

## 2023-07-18 DIAGNOSIS — D3132 Benign neoplasm of left choroid: Secondary | ICD-10-CM | POA: Diagnosis not present

## 2023-07-18 DIAGNOSIS — H52223 Regular astigmatism, bilateral: Secondary | ICD-10-CM | POA: Diagnosis not present

## 2023-07-18 DIAGNOSIS — D3131 Benign neoplasm of right choroid: Secondary | ICD-10-CM | POA: Diagnosis not present

## 2023-07-18 DIAGNOSIS — H524 Presbyopia: Secondary | ICD-10-CM | POA: Diagnosis not present

## 2023-08-02 ENCOUNTER — Encounter (HOSPITAL_COMMUNITY): Payer: Self-pay

## 2023-08-06 ENCOUNTER — Telehealth (HOSPITAL_COMMUNITY): Payer: Self-pay | Admitting: *Deleted

## 2023-08-06 NOTE — Telephone Encounter (Signed)
:  Spoke to patient and gave her instructions regarding her ETT study on 08/09/23 at 9:30.

## 2023-08-09 ENCOUNTER — Ambulatory Visit (HOSPITAL_COMMUNITY)
Admission: RE | Admit: 2023-08-09 | Discharge: 2023-08-09 | Disposition: A | Discharge status: Home / Self Care | Source: Ambulatory Visit | Attending: Cardiology | Admitting: Cardiology

## 2023-08-09 ENCOUNTER — Ambulatory Visit: Payer: Medicare HMO

## 2023-08-09 VITALS — Ht 64.5 in | Wt 194.0 lb

## 2023-08-09 DIAGNOSIS — Z0000 Encounter for general adult medical examination without abnormal findings: Secondary | ICD-10-CM

## 2023-08-09 DIAGNOSIS — R002 Palpitations: Secondary | ICD-10-CM

## 2023-08-09 DIAGNOSIS — R0602 Shortness of breath: Secondary | ICD-10-CM

## 2023-08-09 LAB — EXERCISE TOLERANCE TEST
Angina Index: 0
Duke Treadmill Score: 4
Estimated workload: 5.2
Exercise duration (min): 3 min
Exercise duration (sec): 31 s
MPHR: 153 {beats}/min
Peak HR: 153 {beats}/min
Percent HR: 100 %
Rest HR: 113 {beats}/min
ST Depression (mm): 0 mm

## 2023-08-09 NOTE — Progress Notes (Addendum)
 t  Subjective:   Rebecca Gibson is a 68 y.o. who presents for a Medicare Wellness preventive visit.  As a reminder, Annual Wellness Visits don't include a physical exam, and some assessments may be limited, especially if this visit is performed virtually. We may recommend an in-person follow-up visit with your provider if needed.  Visit Complete: Virtual I connected with  Hilarie Lovely on 08/09/23 by a audio enabled telemedicine application and verified that I am speaking with the correct person using two identifiers.  Patient Location: Home  Provider Location: Home Office  I discussed the limitations of evaluation and management by telemedicine. The patient expressed understanding and agreed to proceed.  Vital Signs: Because this visit was a virtual/telehealth visit, some criteria may be missing or patient reported. Any vitals not documented were not able to be obtained and vitals that have been documented are patient reported.  VideoDeclined- This patient declined Librarian, academic. Therefore the visit was completed with audio only.  Persons Participating in Visit: Patient.  AWV Questionnaire: Yes: Patient Medicare AWV questionnaire was completed by the patient on 08/05/23; I have confirmed that all information answered by patient is correct and no changes since this date.  Cardiac Risk Factors include: advanced age (>46men, >72 women);dyslipidemia;smoking/ tobacco exposure     Objective:     Today's Vitals   08/09/23 1441  Weight: 194 lb (88 kg)  Height: 5' 4.5" (1.638 m)   Body mass index is 32.79 kg/m.     08/09/2023    2:47 PM 08/07/2022   11:21 AM 08/02/2021    1:04 PM  Advanced Directives  Does Patient Have a Medical Advance Directive? No No No  Would patient like information on creating a medical advance directive? Yes (MAU/Ambulatory/Procedural Areas - Information given) No - Patient declined Yes (MAU/Ambulatory/Procedural Areas - Information  given)    Current Medications (verified) Outpatient Encounter Medications as of 08/09/2023  Medication Sig   rosuvastatin  (CRESTOR ) 5 MG tablet Take 1 tablet (5 mg total) by mouth daily.   No facility-administered encounter medications on file as of 08/09/2023.    Allergies (verified) Bee venom, Iodinated contrast media, and Codeine   History: Past Medical History:  Diagnosis Date   Dyslipidemia    SVD (spontaneous vaginal delivery)    x 2   Past Surgical History:  Procedure Laterality Date   APPENDECTOMY     CHOLECYSTECTOMY     HYSTEROSCOPY WITH D & C  03/07/2012   Procedure: DILATATION AND CURETTAGE /HYSTEROSCOPY;  Surgeon: Atlas Blank, DO;  Location: [facility];  Service: Gynecology;  Laterality: N/A;   KNEE SURGERY     right   Family History  Problem Relation Age of Onset   Hypertension Father    Social History   Socioeconomic History   Marital status: Divorced    Spouse name: Not on file   Number of children: 2   Years of education: Not on file   Highest education level: Not on file  Occupational History    Employer: [employer]   Tobacco Use   Smoking status: Every Day    Current packs/day: 1.00    Average packs/day: 0.6 packs/day for 45.4 years (27.9 ttl pk-yrs)    Types: Cigarettes    Start date: 03/16/1977    Last attempt to quit: 03/16/2012   Smokeless tobacco: Never   Tobacco comments:    A pack a day  Substance and Sexual Activity   Alcohol use: Yes    Comment:  socially   Drug use: No   Sexual activity: Yes    Birth control/protection: Post-menopausal  Other Topics Concern   Not on file  Social History Narrative   Lives at home alone.  Son lives beside her.  Four grands.     Social Drivers of Corporate investment banker Strain: Low Risk  (08/09/2023)   Overall Financial Resource Strain (CARDIA)    Difficulty of Paying Living Expenses: Not hard at all  Food Insecurity: No Food Insecurity (08/09/2023)   Hunger Vital Sign    Worried  About Running Out of Food in the Last Year: Never true    Ran Out of Food in the Last Year: Never true  Transportation Needs: No Transportation Needs (08/09/2023)   PRAPARE - Administrator, Civil Service (Medical): No    Lack of Transportation (Non-Medical): No  Physical Activity: Insufficiently Active (08/09/2023)   Exercise Vital Sign    Days of Exercise per Week: 2 days    Minutes of Exercise per Session: 30 min  Stress: No Stress Concern Present (08/09/2023)   [employer] of Occupational Health - Occupational Stress Questionnaire    Feeling of Stress : Not at all  Social Connections: Moderately Isolated (08/09/2023)   Social Connection and Isolation Panel [NHANES]    Frequency of Communication with Friends and Family: More than three times a week    Frequency of Social Gatherings with Friends and Family: Three times a week    Attends Religious Services: Never    Active Member of Clubs or Organizations: Yes    Attends Engineer, structural: More than 4 times per year    Marital Status: Divorced    Tobacco Counseling Ready to quit: Not Answered Counseling given: Not Answered Tobacco comments: A pack a day    Clinical Intake:  Pre-visit preparation completed: Yes  Pain : No/denies pain     Diabetes: No  Lab Results  Component Value Date   HGBA1C 5.8 (H) 11/14/2022   HGBA1C 5.4 08/02/2021   HGBA1C 5.9 (H) 02/15/2021     How often do you need to have someone help you when you read instructions, pamphlets, or other written materials from your doctor or pharmacy?: 1 - Never  Interpreter Needed?: No  Information entered by :: Seabron Cypress LPN   Activities of Daily Living     08/05/2023    3:06 PM  In your present state of health, do you have any difficulty performing the following activities:  Hearing? 0  Vision? 0  Difficulty concentrating or making decisions? 0  Walking or climbing stairs? 0  Dressing or bathing? 0  Doing errands,  shopping? 0  Preparing Food and eating ? N  Using the Toilet? N  In the past six months, have you accidently leaked urine? N  Do you have problems with loss of bowel control? N  Managing your Medications? N  Managing your Finances? N  Housekeeping or managing your Housekeeping? N    Patient Care Team: Eliodoro Guerin, DO as PCP - General (Family Medicine) Atlas Blank, DO as Consulting Physician (Obstetrics and Gynecology) Eduardo Grade, MD as Referring Physician (Dermatology) Tami Falcon, MD as Consulting Physician (Gastroenterology)  Indicate any recent Medical Services you may have received from other than [facility] providers in the past year (date may be approximate).     Assessment:    This is a routine wellness examination for Rebecca Gibson.  Hearing/Vision screen Hearing Screening - Comments:: Denies hearing difficulties  Vision Screening - Comments:: up to date with routine eye exams with [facility]. Alyse July    Goals Addressed             This Visit's Progress    Exercise 3x per week (30 min per time)   Not on track      Depression Screen     08/09/2023    2:44 PM 11/16/2022   11:03 AM 08/07/2022   11:20 AM 08/02/2021    9:27 AM 04/14/2021   12:49 PM 07/22/2020   11:17 AM 04/14/2018    3:45 PM  PHQ 2/9 Scores  PHQ - 2 Score 0 0 0 0 0 0 0  PHQ- 9 Score  0   0  0    Fall Risk     08/05/2023    3:06 PM 08/07/2022   11:19 AM 08/04/2022    8:36 PM 08/02/2021    9:27 AM 04/14/2021   12:49 PM  Fall Risk   Falls in the past year? 0 0 0 0 1  Number falls in past yr: 0 0   0  Injury with Fall? 0 0   1  Risk for fall due to : No Fall Risks No Fall Risks   History of fall(s)  Follow up Falls prevention discussed;Education provided;Falls evaluation completed Falls prevention discussed   Falls evaluation completed    MEDICARE RISK AT HOME:  Medicare Risk at Home Any stairs in or around the home?: (Patient-Rptd) No If so, are there any without handrails?:  (Patient-Rptd) No Home free of loose throw rugs in walkways, pet beds, electrical cords, etc?: (Patient-Rptd) Yes Adequate lighting in your home to reduce risk of falls?: (Patient-Rptd) Yes Life alert?: (Patient-Rptd) No Use of a cane, walker or w/c?: (Patient-Rptd) No Grab bars in the bathroom?: (Patient-Rptd) Yes Shower chair or bench in shower?: (Patient-Rptd) Yes Elevated toilet seat or a handicapped toilet?: (Patient-Rptd) Yes  TIMED UP AND GO:  Was the test performed?  No  Cognitive Function: 6CIT completed    08/02/2021    9:27 AM  MMSE - Mini Mental State Exam  Orientation to time 5  Orientation to Place 5  Registration 3  Attention/ Calculation 5  Recall 3  Language- name 2 objects 2  Language- repeat 1  Language- follow 3 step command 3  Language- read & follow direction 1  Write a sentence 1  Copy design 1  Total score 30        08/09/2023    2:47 PM 08/07/2022   11:22 AM  6CIT Screen  What Year? 0 points 0 points  What month? 0 points 0 points  What time? 0 points 0 points  Count back from 20 0 points 0 points  Months in reverse 0 points 0 points  Repeat phrase 0 points 0 points  Total Score 0 points 0 points    Immunizations Immunization History  Administered Date(s) Administered   PNEUMOCOCCAL CONJUGATE-20 11/16/2022   Pneumococcal Conjugate-13 04/14/2018   Tdap 11/16/2022   Zoster Recombinant(Shingrix ) 04/14/2018, 10/02/2018    Screening Tests Health Maintenance  Topic Date Due   Colonoscopy  Never done   COVID-19 Vaccine (1 - 2024-25 season) Never done   MAMMOGRAM  03/08/2023   INFLUENZA VACCINE  10/25/2023   Lung Cancer Screening  02/08/2024   Medicare Annual Wellness (AWV)  08/08/2024   DTaP/Tdap/Td (2 - Td or Tdap) 11/15/2032   Pneumonia Vaccine 26+ Years old  Completed   DEXA SCAN  Completed  Hepatitis C Screening  Completed   Zoster Vaccines- Shingrix   Completed   HPV VACCINES  Aged Out   Meningococcal B Vaccine  Aged Out     Health Maintenance  Health Maintenance Due  Topic Date Due   Colonoscopy  Never done   COVID-19 Vaccine (1 - 2024-25 season) Never done   MAMMOGRAM  03/08/2023   Health Maintenance Items Addressed: Records from last colonoscopy requested  Additional Screening:  Vision Screening: Recommended annual ophthalmology exams for early detection of glaucoma and other disorders of the eye.  Dental Screening: Recommended annual dental exams for proper oral hygiene  Community Resource Referral / Chronic Care Management: CRR required this visit?  No   CCM required this visit?  No   Plan:    I have personally reviewed and noted the following in the patient's chart:   Medical and social history Use of alcohol, tobacco or illicit drugs  Current medications and supplements including opioid prescriptions. Patient is not currently taking opioid prescriptions. Functional ability and status Nutritional status Physical activity Advanced directives List of other physicians Hospitalizations, surgeries, and ER visits in previous 12 months Vitals Screenings to include cognitive, depression, and falls Referrals and appointments  In addition, I have reviewed and discussed with patient certain preventive protocols, quality metrics, and best practice recommendations. A written personalized care plan for preventive services as well as general preventive health recommendations were provided to patient.   Seabron Cypress [place], [place]   1/91/4782   After Visit Summary: (MyChart) Due to this being a telephonic visit, the after visit summary with patients personalized plan was offered to patient via MyChart   Notes: PCP Follow Up Recommendations: Patient would like to discuss weight management and smoking cessation at next appointment

## 2023-08-09 NOTE — Patient Instructions (Signed)
 Rebecca Gibson , Thank you for taking time out of your busy schedule to complete your Annual Wellness Visit with me. I enjoyed our conversation and look forward to speaking with you again next year. I, as well as your care team,  appreciate your ongoing commitment to your health goals. Please review the following plan we discussed and let me know if I can assist you in the future. Your Game plan/ To Do List    Follow up Visits: Next Medicare AWV with our clinical staff: In 1 year    Have you seen your provider in the last 6 months (3 months if uncontrolled diabetes)? No Next Office Visit with your provider: Scheduled for yearly 11/18/23 @ 9:00  Clinician Recommendations:  Aim for 30 minutes of exercise or brisk walking, 6-8 glasses of water, and 5 servings of fruits and vegetables each day.       This is a list of the screening recommended for you and due dates:  Health Maintenance  Topic Date Due   Colon Cancer Screening  Never done   COVID-19 Vaccine (1 - 2024-25 season) Never done   Mammogram  03/08/2023   Flu Shot  10/25/2023   Screening for Lung Cancer  02/08/2024   Medicare Annual Wellness Visit  08/08/2024   DTaP/Tdap/Td vaccine (2 - Td or Tdap) 11/15/2032   Pneumonia Vaccine  Completed   DEXA scan (bone density measurement)  Completed   Hepatitis C Screening  Completed   Zoster (Shingles) Vaccine  Completed   HPV Vaccine  Aged Out   Meningitis B Vaccine  Aged Out    Advanced directives: (ACP Link)Information on Advanced Care Planning can be found at [place]  Secretary of [facility] Advance Health Care Directives Advance Health Care Directives. [email or web address]   Advance Care Planning is important because it:  [x]  Makes sure you receive the medical care that is consistent with your values, goals, and preferences  [x]  It provides guidance to your family and loved ones and reduces their decisional burden about whether or not they are making the right decisions based on your  wishes.  Follow the link provided in your after visit summary or read over the paperwork we have mailed to you to help you started getting your Advance Directives in place. If you need assistance in completing these, please reach out to us  so that we can help you!  See attachments for Preventive Care and Fall Prevention Tips.

## 2023-08-11 ENCOUNTER — Ambulatory Visit: Payer: Self-pay | Admitting: Cardiology

## 2023-08-11 DIAGNOSIS — R0602 Shortness of breath: Secondary | ICD-10-CM

## 2023-09-04 DIAGNOSIS — L814 Other melanin hyperpigmentation: Secondary | ICD-10-CM | POA: Diagnosis not present

## 2023-09-04 DIAGNOSIS — L821 Other seborrheic keratosis: Secondary | ICD-10-CM | POA: Diagnosis not present

## 2023-09-04 DIAGNOSIS — D225 Melanocytic nevi of trunk: Secondary | ICD-10-CM | POA: Diagnosis not present

## 2023-09-04 DIAGNOSIS — Z85828 Personal history of other malignant neoplasm of skin: Secondary | ICD-10-CM | POA: Diagnosis not present

## 2023-09-04 DIAGNOSIS — Z08 Encounter for follow-up examination after completed treatment for malignant neoplasm: Secondary | ICD-10-CM | POA: Diagnosis not present

## 2023-09-04 DIAGNOSIS — L905 Scar conditions and fibrosis of skin: Secondary | ICD-10-CM | POA: Diagnosis not present

## 2023-10-28 ENCOUNTER — Ambulatory Visit: Payer: Self-pay

## 2023-10-28 DIAGNOSIS — J3489 Other specified disorders of nose and nasal sinuses: Secondary | ICD-10-CM | POA: Diagnosis not present

## 2023-10-28 DIAGNOSIS — U071 COVID-19: Secondary | ICD-10-CM | POA: Diagnosis not present

## 2023-10-28 NOTE — Telephone Encounter (Signed)
 Noted- patient is going to urgent care

## 2023-10-28 NOTE — Telephone Encounter (Signed)
 FYI Only or Action Required?: FYI only for provider.  Patient was last seen in primary care on 11/16/2022 by Jolinda Norene HERO, DO.  Called Nurse Triage reporting Covid Positive.  Symptoms began several days ago.  Interventions attempted: OTC medications: Tylenol .  Symptoms are: fever, chills, dry heaves, head cold, COVID 19 self test positive gradually worsening.  Triage Disposition: Home Care  Patient/caregiver understands and will follow disposition?: Yes                 Copied from CRM [id number]. Topic: Clinical - Medication Question >> Oct 28, 2023 11:21 AM Tonda B wrote: Reason for RMF:ejupzwu is calling in because she had tested positive for covid 19 and she wants to ask some questions concerning the rx for covid and her options Reason for Disposition  [1] COVID-19 diagnosed by positive lab test (e.g., PCR, rapid self-test kit) AND [2] mild symptoms (e.g., cough, fever, others) AND [3] no complications or SOB  Answer Assessment - Initial Assessment Questions Patient states she would like to get started on Paxlovid. She made an appointment and showed up to urgent care yesterday but it was closed. She states she tried to go again today and they had a note on their door that said the urgent care was closed due to their systems being down. In the middle of triage, patient stated she was receiving a call back from [facility] urgent care. Call disconnected. Called patient back and she states their systems are up and she would rather just go to urgent care. Unable to complete triage.  1. COVID-19 DIAGNOSIS: How do you know that you have COVID? (e.g., positive lab test or self-test, diagnosed by doctor or NP/PA, symptoms after exposure).     Self test/home test, positive last night.  2. COVID-19 EXPOSURE: Was there any known exposure to COVID before the symptoms began? CDC Definition of close contact: within 6 feet (2 meters) for a total of 15 minutes or more over a 24-hour  period.      Unsure.  3. ONSET: When did the COVID-19 symptoms start?      Saturday morning. Started out with sneezing, sore throat and is gradually worsening.  4. WORST SYMPTOM: What is your worst symptom? (e.g., cough, fever, shortness of breath, muscle aches)     She states right now it feels like a really bad head cold.  5. COUGH: Do you have a cough? If Yes, ask: How bad is the cough?       Unable to assess.  6. FEVER: Do you have a fever? If Yes, ask: What is your temperature, how was it measured, and when did it start?     She states she felt a fever Saturday night but did not state how high.  7. RESPIRATORY STATUS: Describe your breathing? (e.g., normal; shortness of breath, wheezing, unable to speak)      No labored breathing or wheezing noted, able to speak in full sentences.  8. BETTER-SAME-WORSE: Are you getting better, staying the same or getting worse compared to yesterday?  If getting worse, ask, In what way?     Unable to assess. 9. OTHER SYMPTOMS: Do you have any other symptoms?  (e.g., chills, fatigue, headache, loss of smell or taste, muscle pain, sore throat)     Chills, dry heaves.  10. HIGH RISK DISEASE: Do you have any chronic medical problems? (e.g., asthma, heart or lung disease, weak immune system, obesity, etc.)       None per chart.  11. VACCINE: Have you had the COVID-19 vaccine? If Yes, ask: Which one, how many shots, when did you get it?       Unable to assess.  12. PREGNANCY: Is there any chance you are pregnant? When was your last menstrual period?       N/A.  13. O2 SATURATION MONITOR:  Do you use an oxygen saturation monitor (pulse oximeter) at home? If Yes, ask What is your reading (oxygen level) today? What is your usual oxygen saturation reading? (e.g., 95%)       Unable to assess.  Protocols used: Coronavirus (COVID-19) Diagnosed or Suspected-A-AH

## 2023-11-13 ENCOUNTER — Telehealth: Payer: Self-pay | Admitting: Family Medicine

## 2023-11-13 NOTE — Telephone Encounter (Signed)
 Patient has lab appt 8-21 for CPE with Dr. KANDICE on 8-25. Orders needs to be added.

## 2023-11-14 ENCOUNTER — Other Ambulatory Visit: Payer: Medicare HMO

## 2023-11-14 ENCOUNTER — Ambulatory Visit: Payer: Self-pay | Admitting: Family Medicine

## 2023-11-14 DIAGNOSIS — R7303 Prediabetes: Secondary | ICD-10-CM

## 2023-11-14 DIAGNOSIS — Z0000 Encounter for general adult medical examination without abnormal findings: Secondary | ICD-10-CM

## 2023-11-14 LAB — LIPID PANEL

## 2023-11-14 LAB — BAYER DCA HB A1C WAIVED: HB A1C (BAYER DCA - WAIVED): 6 % — ABNORMAL HIGH (ref 4.8–5.6)

## 2023-11-15 LAB — CMP14+EGFR
ALT: 9 IU/L (ref 0–32)
AST: 14 IU/L (ref 0–40)
Albumin: 4.2 g/dL (ref 3.9–4.9)
Alkaline Phosphatase: 87 IU/L (ref 44–121)
BUN/Creatinine Ratio: 25 (ref 12–28)
BUN: 15 mg/dL (ref 8–27)
Bilirubin Total: 0.9 mg/dL (ref 0.0–1.2)
CO2: 23 mmol/L (ref 20–29)
Calcium: 9.5 mg/dL (ref 8.7–10.3)
Chloride: 104 mmol/L (ref 96–106)
Creatinine, Ser: 0.61 mg/dL (ref 0.57–1.00)
Globulin, Total: 2.4 g/dL (ref 1.5–4.5)
Glucose: 102 mg/dL — AB (ref 70–99)
Potassium: 4.5 mmol/L (ref 3.5–5.2)
Sodium: 142 mmol/L (ref 134–144)
Total Protein: 6.6 g/dL (ref 6.0–8.5)
eGFR: 97 mL/min/1.73 (ref >59)

## 2023-11-15 LAB — CBC WITH DIFFERENTIAL/PLATELET
Basophils Absolute: 0.1 x10E3/uL (ref 0.0–0.2)
Basos: 1 %
EOS (ABSOLUTE): 0.2 x10E3/uL (ref 0.0–0.4)
Eos: 3 %
Hematocrit: 46.4 % (ref 34.0–46.6)
Hemoglobin: 15.3 g/dL (ref 11.1–15.9)
Immature Grans (Abs): 0 x10E3/uL (ref 0.0–0.1)
Immature Granulocytes: 0 %
Lymphocytes Absolute: 2.8 x10E3/uL (ref 0.7–3.1)
Lymphs: 34 %
MCH: 31.4 pg (ref 26.6–33.0)
MCHC: 33 g/dL (ref 31.5–35.7)
MCV: 95 fL (ref 79–97)
Monocytes Absolute: 0.7 x10E3/uL (ref 0.1–0.9)
Monocytes: 9 %
Neutrophils Absolute: 4.2 x10E3/uL (ref 1.4–7.0)
Neutrophils: 53 %
Platelets: 186 x10E3/uL (ref 150–450)
RBC: 4.88 x10E6/uL (ref 3.77–5.28)
RDW: 12.3 % (ref 11.7–15.4)
WBC: 8 x10E3/uL (ref 3.4–10.8)

## 2023-11-15 LAB — LIPID PANEL
Cholesterol, Total: 98 mg/dL — AB (ref 100–199)
HDL: 42 mg/dL (ref >39)
LDL CALC COMMENT:: 2.3 ratio (ref 0.0–4.4)
LDL Chol Calc (NIH): 27 mg/dL (ref 0–99)
Triglycerides: 176 mg/dL — AB (ref 0–149)
VLDL Cholesterol Cal: 29 mg/dL (ref 5–40)

## 2023-11-15 LAB — TSH: TSH: 1.84 u[IU]/mL (ref 0.450–4.500)

## 2023-11-18 ENCOUNTER — Ambulatory Visit (INDEPENDENT_AMBULATORY_CARE_PROVIDER_SITE_OTHER): Payer: Medicare HMO | Admitting: Family Medicine

## 2023-11-18 ENCOUNTER — Encounter: Payer: Self-pay | Admitting: Family Medicine

## 2023-11-18 VITALS — BP 125/76 | HR 90 | Temp 98.0°F | Ht 64.5 in | Wt 187.5 lb

## 2023-11-18 DIAGNOSIS — Z0001 Encounter for general adult medical examination with abnormal findings: Secondary | ICD-10-CM | POA: Diagnosis not present

## 2023-11-18 DIAGNOSIS — J432 Centrilobular emphysema: Secondary | ICD-10-CM | POA: Diagnosis not present

## 2023-11-18 DIAGNOSIS — R7303 Prediabetes: Secondary | ICD-10-CM | POA: Insufficient documentation

## 2023-11-18 DIAGNOSIS — I700 Atherosclerosis of aorta: Secondary | ICD-10-CM | POA: Insufficient documentation

## 2023-11-18 DIAGNOSIS — E781 Pure hyperglyceridemia: Secondary | ICD-10-CM | POA: Diagnosis not present

## 2023-11-18 DIAGNOSIS — Z0000 Encounter for general adult medical examination without abnormal findings: Secondary | ICD-10-CM

## 2023-11-18 DIAGNOSIS — Z1211 Encounter for screening for malignant neoplasm of colon: Secondary | ICD-10-CM

## 2023-11-18 DIAGNOSIS — R931 Abnormal findings on diagnostic imaging of heart and coronary circulation: Secondary | ICD-10-CM | POA: Insufficient documentation

## 2023-11-18 DIAGNOSIS — R4189 Other symptoms and signs involving cognitive functions and awareness: Secondary | ICD-10-CM | POA: Diagnosis not present

## 2023-11-18 DIAGNOSIS — F17210 Nicotine dependence, cigarettes, uncomplicated: Secondary | ICD-10-CM | POA: Insufficient documentation

## 2023-11-18 MED ORDER — VARENICLINE TARTRATE (STARTER) 0.5 MG X 11 & 1 MG X 42 PO TBPK: ORAL_TABLET | ORAL | 0 refills | Status: AC

## 2023-11-18 MED ORDER — ROSUVASTATIN CALCIUM 5 MG PO TABS: 5.0000 mg | ORAL_TABLET | Freq: Every day | ORAL | 3 refills | Status: AC

## 2023-11-18 MED ORDER — VARENICLINE TARTRATE 1 MG PO TABS: 1.0000 mg | ORAL_TABLET | Freq: Two times a day (BID) | ORAL | 4 refills | Status: AC

## 2023-11-18 NOTE — Patient Instructions (Addendum)
 Treadmill test was NEGATIVE for ischemia (evidence of heart attack signs) He ordered lung function tests.  Make sure you follow up to get those scheduled.  I suspect he thinks your shortness of breath is related to smoking/ COPD.  Chronic Obstructive Pulmonary Disease  Chronic obstructive pulmonary disease (COPD) is a long-term (chronic) lung problem. When you have COPD, it can feel harder to breathe in or out. The condition may get worse over time. There are things you can do to keep yourself as healthy as possible. What are the causes? Smoking. This is the most common cause. Breathing in fumes, smoke, or chemicals for a long time. Genes that are inherited, which means they are passed down from parent to child. What are the signs or symptoms? Shortness of breath. This may happen all the time. This may get worse when you move your body. This may get worse over time. You may have times when this becomes much worse all of a sudden. These are called flare-ups or exacerbations. A long-term cough, with or without thick mucus. Wheezing. Chest tightness. Feeling tired. Not being able to do activities like you used to do. How is this diagnosed? This condition is diagnosed based on: Your medical history. A physical exam. Lung (pulmonary) function tests. You may have a test that measures the air flow out of the lungs when you breathe out. You may also have tests, including: Chest X-ray. CT scan. Blood tests. How is this treated? This condition may be treated by: Quitting smoking, if you smoke. Using oxygen. Taking medicines. These may include: Inhalers. These have medicines in them that you breathe in. Daily inhalers. These help to prevent symptoms from happening. They are usually taken every day to prevent COPD flare-ups. Quick relief inhalers. These act fast to relieve symptoms. They are used only when needed and provide short-term relief. Other medicines that you breathe in or  swallow. These may be used to open the airways, thin mucus, or treat infections. Breathing exercises to help you control or catch your breath. A mucus clearing device, if you have a lot of thick mucus. Pulmonary rehab. A place where you will learn about your condition and the best ways for you to manage it. Surgery. Follow these instructions at home: Medicines Take your medicines as told by your health care provider. Talk to your provider before taking any cough or allergy medicines. You may need to avoid medicines that cause your lungs to be dry. Lifestyle Several times a day, wash your hands with soap and water for at least 20 seconds. If you cannot use soap and water, use hand sanitizer. This may help keep you from getting an infection. Avoid being around crowds or people who are sick. Do not smoke or use any products that contain nicotine  or tobacco. If you need help quitting, ask your provider. Stay active. Learn how to pace your activity during the day. Learn how to breathe to control your stress and catch your breath. Drink enough fluid to keep your pee (urine) pale yellow, unless you have been told not to. Eat healthy foods. Eat smaller meals more often. Get enough sleep. Most adults need 7 or more hours per night. General instructions Make a COPD action plan with your provider. This helps you to know what to do if you feel worse than usual. Make sure you get all the shots, also called vaccines, that your provider recommends. Ask your provider about a flu shot and a pneumonia shot. If you need  home oxygen therapy, ask your provider how often to check your oxygen level with a device called an oximeter. Keep all follow-up visits to review your COPD action plan. Your provider will want to check on your condition often to keep you healthy and out of the hospital. Contact a health care provider if: You are coughing up more mucus than usual. There is a change in the color or thickness of  the mucus. It is harder to breathe than usual or you are short of breath while you are resting. You need to use your quick relief inhaler more often. You have trouble doing your normal activities such as getting dressed or walking in the house. Your skin color or fingernails turn blue. You have a fever or chills. Get help right away if: You are short of breath and cannot: Talk in full sentences. Do normal activities. You have chest pain. You feel confused. These symptoms may be an emergency. Call 911 right away. Do not wait to see if the symptoms will go away. Do not drive yourself to the hospital. This information is not intended to replace advice given to you by your health care provider. Make sure you discuss any questions you have with your health care provider. Document Revised: 12/13/2022 Document Reviewed: 05/28/2022 Elsevier Patient Education  2024 [employer].

## 2023-11-18 NOTE — Progress Notes (Signed)
 Rebecca Gibson is a 68 y.o. female presents to office today for annual physical exam examination.    Discussed the use of AI scribe software for clinical note transcription with the patient, who gave verbal consent to proceed.  History of Present Illness   Rebecca Gibson is a 68 year old female with prediabetes and hyperlipidemia who presents for follow-up of her lab results and recent COVID-19 infection.  Recent lab results show elevated triglycerides and blood sugar levels in the prediabetes range. She takes her cholesterol medication daily, although it was previously adjusted to twice a week. She is concerned about her lab results and is working on dietary changes to manage her sugar levels.  She saw Dr Lavona, who ran a POET test that was negative.  Recommended PFTs given emphysema on CT lung cancer screening. She has not done these yet.  Wants to quit smoking.  Patches too expensive so did not proceed last year.  Wants to try Chantix .  She experienced a COVID-19 infection about a month ago, beginning with a mild sore throat, followed by severe fatigue, dry heaves, difficulty breathing, and body aches. She attempted to visit urgent care but found it closed. She later tested positive for COVID-19. She had nighttime fevers for three to four nights, managed with Tylenol  and ibuprofen , which helped her sleep better. She reports 'brain fog' since the infection, affecting morning activities and cognitive tasks like crossword puzzles.  She has a history of taking over-the-counter B12 supplements, which she has run out of, and turmeric. She reports changes in her vision since the COVID-19 infection, although her glasses prescription was recently updated. She wonders if she is approaching cataract development, though her eye doctor has not confirmed this.  In terms of lifestyle, she is trying to improve her diet by avoiding high-sugar foods and reducing carbohydrate intake, with occasional indulgences in  sweets like Skittles and M&M's. She has resumed walking regularly with a neighbor and plans to return to the gym for structured exercise. She acknowledges that her exercise routine lapsed over the winter but has been more consistent since spring.  No major changes in hearing or swallowing reported. No recent skin changes reported. She notes feeling 'feisty' at times.      Occupation: retired but cares for her grandkids, Substance use: tobacco, heavy use. No b symptoms. Getting yearly lung cancer screening. Showed mild emphysema, Ao atherosclerosis. Health Maintenance Due  Topic Date Due   Colonoscopy  Never done   Refills needed today: all  Immunization History  Administered Date(s) Administered   PNEUMOCOCCAL CONJUGATE-20 11/16/2022   Pneumococcal Conjugate-13 04/14/2018   Tdap 11/16/2022   Zoster Recombinant(Shingrix ) 04/14/2018, 10/02/2018   Past Medical History:  Diagnosis Date   Dyslipidemia    SVD (spontaneous vaginal delivery)    x 2   Social History   Socioeconomic History   Marital status: Divorced    Spouse name: Not on file   Number of children: 2   Years of education: Not on file   Highest education level: Some college, no degree  Occupational History    Employer: [employer]   Tobacco Use   Smoking status: Every Day    Current packs/day: 1.00    Average packs/day: 0.6 packs/day for 45.6 years (28.1 ttl pk-yrs)    Types: Cigarettes    Start date: 03/16/1977    Last attempt to quit: 03/16/2012   Smokeless tobacco: Never   Tobacco comments:    A pack a day  Substance  and Sexual Activity   Alcohol use: Yes    Comment: socially   Drug use: No   Sexual activity: Yes    Birth control/protection: Post-menopausal  Other Topics Concern   Not on file  Social History Narrative   Lives at home alone.  Son lives beside her.  Four grands.     Social Drivers of Corporate investment banker Strain: Low Risk  (11/17/2023)   Overall Financial Resource Strain  (CARDIA)    Difficulty of Paying Living Expenses: Not very hard  Food Insecurity: No Food Insecurity (11/17/2023)   Hunger Vital Sign    Worried About Running Out of Food in the Last Year: Never true    Ran Out of Food in the Last Year: Never true  Transportation Needs: No Transportation Needs (11/17/2023)   PRAPARE - Administrator, Civil Service (Medical): No    Lack of Transportation (Non-Medical): No  Physical Activity: Sufficiently Active (11/17/2023)   Exercise Vital Sign    Days of Exercise per Week: 5 days    Minutes of Exercise per Session: 30 min  Stress: No Stress Concern Present (08/09/2023)   [employer] of Occupational Health - Occupational Stress Questionnaire    Feeling of Stress : Not at all  Social Connections: Moderately Isolated (11/17/2023)   Social Connection and Isolation Panel    Frequency of Communication with Friends and Family: More than three times a week    Frequency of Social Gatherings with Friends and Family: Three times a week    Attends Religious Services: Patient declined    Active Member of Clubs or Organizations: Yes    Attends Banker Meetings: More than 4 times per year    Marital Status: Divorced  Intimate Partner Violence: Not At Risk (08/09/2023)   Humiliation, Afraid, Rape, and Kick questionnaire    Fear of Current or Ex-Partner: No    Emotionally Abused: No    Physically Abused: No    Sexually Abused: No   Past Surgical History:  Procedure Laterality Date   APPENDECTOMY     CHOLECYSTECTOMY     HYSTEROSCOPY WITH D & C  03/07/2012   Procedure: DILATATION AND CURETTAGE /HYSTEROSCOPY;  Surgeon: Donna Just, DO;  Location: [facility];  Service: Gynecology;  Laterality: N/A;   KNEE SURGERY     right   Family History  Problem Relation Age of Onset   Hypertension Father     Current Outpatient Medications:    varenicline  (CHANTIX  CONTINUING MONTH PAK) 1 MG tablet, Take 1 tablet (1 mg total) by mouth 2 (two)  times daily. Month #2, Disp: 60 tablet, Rfl: 4   Varenicline  Tartrate, Starter, (CHANTIX  STARTING MONTH PAK) 0.5 MG X 11 & 1 MG X 42 TBPK, Take 0.5 mg tablet by mouth once daily x3 days, then 0.5 mg tablet twice daily x4 days, then increase to one 1 mg tablet twice daily. Month #1, Disp: 53 each, Rfl: 0   rosuvastatin  (CRESTOR ) 5 MG tablet, Take 1 tablet (5 mg total) by mouth daily., Disp: 100 tablet, Rfl: 3  Allergies  Allergen Reactions   Bee Venom Swelling   Iodinated Contrast Media Hives and Swelling   Codeine Nausea Only     ROS: Review of Systems Pertinent items noted in HPI and remainder of comprehensive ROS otherwise negative.    Physical exam BP 125/76   Pulse 90   Temp 98 F (36.7 C)   Ht 5' 4.5 (1.638 m)   Wt  187 lb 8 oz (85 kg)   SpO2 94%   BMI 31.69 kg/m  General appearance: alert, cooperative, appears stated age, no distress, and moderately obese Head: Normocephalic, without obvious abnormality, atraumatic Eyes: negative findings: lids and lashes normal, conjunctivae and sclerae normal, corneas clear, and pupils equal, round, reactive to light and accomodation Ears: normal TM's and external ear canals both ears Nose: Nares normal. Septum midline. Mucosa normal. No drainage or sinus tenderness. Throat: lips, mucosa, and tongue normal; teeth and gums normal Neck: no adenopathy, no carotid bruit, supple, symmetrical, trachea midline, and thyroid  not enlarged, symmetric, no tenderness/mass/nodules Back: symmetric, no curvature. ROM normal. No CVA tenderness. Lungs: clear to auscultation bilaterally Heart: regular rate and rhythm, S1, S2 normal, no murmur, click, rub or gallop Abdomen: soft, non-tender; bowel sounds normal; no masses,  no organomegaly Extremities: extremities normal, atraumatic, no cyanosis or edema Pulses: 2+ and symmetric Skin: Several areas of solar lentigo, pigmented nevi and postinflammatory hypopigmentation Lymph nodes: Cervical,  supraclavicular, and axillary nodes normal. Neurologic: Grossly normal      11/18/2023    9:52 AM 08/02/2021    9:27 AM  MMSE - Mini Mental State Exam  Orientation to time 5 5  Orientation to Place 5 5  Registration 3 3  Attention/ Calculation 5 5  Recall 2 3  Language- name 2 objects 2 2  Language- repeat 1 1  Language- follow 3 step command 3 3  Language- read & follow direction 1 1  Write a sentence 1 1  Copy design 0 1  Total score 28 30       11/18/2023    9:26 AM 08/09/2023    2:44 PM 11/16/2022   11:03 AM  Depression screen PHQ 2/9  Decreased Interest 0 0 0  Down, Depressed, Hopeless 0 0 0  PHQ - 2 Score 0 0 0  Altered sleeping 2  0  Tired, decreased energy 1  0  Change in appetite 0  0  Feeling bad or failure about yourself  0  0  Trouble concentrating 1  0  Moving slowly or fidgety/restless 0  0  Suicidal thoughts 0  0  PHQ-9 Score 4  0  Difficult doing work/chores Somewhat difficult  Not difficult at all      11/18/2023    9:27 AM 11/16/2022   11:03 AM 08/02/2021    9:27 AM 04/14/2021   12:50 PM  GAD 7 : Generalized Anxiety Score  Nervous, Anxious, on Edge 0 0 0 0  Control/stop worrying 0 0 0 0  Worry too much - different things 0 0 0 0  Trouble relaxing 0 0 0 0  Restless 0 0 0 0  Easily annoyed or irritable 1 0 0 0  Afraid - awful might happen 0 0 0 0  Total GAD 7 Score 1 0 0 0  Anxiety Difficulty Somewhat difficult Not difficult at all Not difficult at all      Assessment/ Plan: Rebecca Gibson here for annual physical exam.   Annual physical exam  Hypertriglyceridemia - Plan: rosuvastatin  (CRESTOR ) 5 MG tablet, Lipid panel  Agatston CAC score, <100 - Plan: Lipid panel  Pre-diabetes - Plan: Bayer DCA Hb A1c Waived  Heavy tobacco smoker >10 cigarettes per day - Plan: varenicline  (CHANTIX  CONTINUING MONTH PAK) 1 MG tablet, Varenicline  Tartrate, Starter, (CHANTIX  STARTING MONTH PAK) 0.5 MG X 11 & 1 MG X 42 TBPK  Colon cancer screening  Brain fog -  Plan: Vitamin B12  Centrilobular  emphysema (HCC)  Aortic atherosclerosis (HCC)  Assessment and Plan  Health maintenance - ROI for colonoscopy from 2020.    Hypertriglyceridemia w/ elevated CAC at LAD and Aortic atherosclerosis on statin therapy Triglycerides elevated despite statin therapy. LDL levels excellent due to adherence. No concerns about low cholesterol. - Continue current statin therapy. - Recheck cholesterol levels in six months.  Prediabetes Blood sugar levels remain in prediabetes range. Discussed dietary modifications and exercise improvements. - Encourage dietary modifications to reduce carbohydrate and sugar intake. - Recheck blood sugar levels in six months.  Cognitive symptoms following recent COVID-19 infection (brain fog) Reports brain fog post-COVID-19. Discussed post-COVID cognitive symptoms and potential B12 deficiency. - Perform MMSE to assess baseline cognitive function. 28/30 today. - Discussed impact of smoking/ high sugar on brain today. - Plan for yearly MMSE given mom's h/o Dementia - Place standing order for B12 level test.  Tobacco use disorder - Action phase of cessation, wants Chantix  - PFTs pending per cardiology order. Stress test negative. - Emphysema/ COPD on CT noted       Counseled on healthy lifestyle choices, including diet (rich in fruits, vegetables and lean meats and low in salt and simple carbohydrates) and exercise (at least 30 minutes of moderate physical activity daily).  Patient to follow up 61m for lipid/ predm/ smoking  Rebecca Gibson M. Jolinda, DO

## 2024-01-03 ENCOUNTER — Other Ambulatory Visit: Payer: Self-pay | Admitting: Family Medicine

## 2024-01-03 ENCOUNTER — Telehealth: Payer: Self-pay | Admitting: Family Medicine

## 2024-01-03 DIAGNOSIS — E781 Pure hyperglyceridemia: Secondary | ICD-10-CM

## 2024-01-03 NOTE — Telephone Encounter (Signed)
 Copied from CRM [id number]. Topic: Clinical - Prescription Issue >> Jan 03, 2024 10:24 AM Rebecca Gibson wrote: Reason for CRM: The patient takes rosuvastatin  (Crestor ) 5 mg tablets and reports that the provider renewed the prescription. However, the patient received a text from [facility] stating it wasn't time for a refill due to insurance. [facility] also indicated that the provider denied the renewal. The patient is requesting clarification on why the renewal was denied. The patient is requesting a callback at [phone]

## 2024-01-03 NOTE — Telephone Encounter (Signed)
 Left detailed message making patient aware that Rx was denied for more refills to be sent because PCP sent the Rosuvastatin  5mg  Rx to the pharmacy on 11/18/2023 for 100 tablets plus 3 refills which is at least a years worth of medicine so she doesn't need more refills sent in. Advised patient to call back if she had additional questions.

## 2024-02-25 ENCOUNTER — Ambulatory Visit (HOSPITAL_BASED_OUTPATIENT_CLINIC_OR_DEPARTMENT_OTHER)
Admission: RE | Admit: 2024-02-25 | Discharge: 2024-02-25 | Disposition: A | Source: Ambulatory Visit | Attending: *Deleted | Admitting: *Deleted

## 2024-02-25 DIAGNOSIS — K5710 Diverticulosis of small intestine without perforation or abscess without bleeding: Secondary | ICD-10-CM | POA: Insufficient documentation

## 2024-02-25 DIAGNOSIS — Z87891 Personal history of nicotine dependence: Secondary | ICD-10-CM

## 2024-02-25 DIAGNOSIS — F17210 Nicotine dependence, cigarettes, uncomplicated: Secondary | ICD-10-CM | POA: Diagnosis not present

## 2024-02-25 DIAGNOSIS — I700 Atherosclerosis of aorta: Secondary | ICD-10-CM | POA: Diagnosis not present

## 2024-02-25 DIAGNOSIS — K449 Diaphragmatic hernia without obstruction or gangrene: Secondary | ICD-10-CM | POA: Diagnosis not present

## 2024-02-25 DIAGNOSIS — J439 Emphysema, unspecified: Secondary | ICD-10-CM | POA: Insufficient documentation

## 2024-02-25 DIAGNOSIS — I2510 Atherosclerotic heart disease of native coronary artery without angina pectoris: Secondary | ICD-10-CM | POA: Diagnosis not present

## 2024-02-25 DIAGNOSIS — Z122 Encounter for screening for malignant neoplasm of respiratory organs: Secondary | ICD-10-CM | POA: Diagnosis not present

## 2024-03-02 ENCOUNTER — Other Ambulatory Visit: Payer: Self-pay

## 2024-03-02 DIAGNOSIS — Z122 Encounter for screening for malignant neoplasm of respiratory organs: Secondary | ICD-10-CM

## 2024-03-02 DIAGNOSIS — F17210 Nicotine dependence, cigarettes, uncomplicated: Secondary | ICD-10-CM

## 2024-03-02 DIAGNOSIS — Z87891 Personal history of nicotine dependence: Secondary | ICD-10-CM

## 2024-05-21 ENCOUNTER — Other Ambulatory Visit: Payer: Self-pay

## 2024-05-25 ENCOUNTER — Ambulatory Visit: Payer: Self-pay | Admitting: Family Medicine

## 2024-08-11 ENCOUNTER — Ambulatory Visit: Payer: Self-pay

## 2024-11-19 ENCOUNTER — Other Ambulatory Visit: Payer: Self-pay

## 2024-11-24 ENCOUNTER — Encounter: Payer: Self-pay | Admitting: Family Medicine
